# Patient Record
Sex: Female | Born: 1960 | Race: White | Hispanic: No | Marital: Married
Health system: Midwestern US, Academic
[De-identification: ages and names within clinical notes are randomized; demographics above are authoritative.]

---

## 2021-04-24 ENCOUNTER — Encounter: Admit: 2021-04-24 | Discharge: 2021-04-24 | Payer: BC Managed Care – HMO

## 2021-04-26 ENCOUNTER — Encounter: Admit: 2021-04-26 | Discharge: 2021-04-26 | Payer: BC Managed Care – HMO

## 2021-04-26 NOTE — Patient Instructions
It was nice to see you today.  Thank you for choosing to visit our clinic.  Your time is important, and if you had to wait today, we do apologize.  Our goal is to run exactly on time.  However, on occasion, we get behind in clinic due to unexpected patient issues.  Thank you for your patience.    General Instructions:  Scheduling:  Our scheduling phone number is [phone].  Appointment Reminders on your cell phone:  Communication preferences can be managed in MyChart to ensure you receive important appointment notifications  How to reach our office:  Please send a MyChart message to the [facility] Center (directed to Dr. Cordell) or leave a voicemail for the nurse, Arly Salminen, at [phone].  Support for many chronic illnesses is available through [facility] at [email or web address] or [phone].    For help with MyChart:  please call [phone].    For more information on spinal conditions:  please visit [email or web address]     Again, thank you for coming in today.

## 2021-04-27 ENCOUNTER — Encounter: Admit: 2021-04-27 | Discharge: 2021-04-27 | Payer: BC Managed Care – HMO

## 2021-04-27 DIAGNOSIS — F909 Attention-deficit hyperactivity disorder, unspecified type: Secondary | ICD-10-CM

## 2021-04-27 DIAGNOSIS — M5136 Other intervertebral disc degeneration, lumbar region: Secondary | ICD-10-CM

## 2021-04-27 DIAGNOSIS — M2550 Pain in unspecified joint: Secondary | ICD-10-CM

## 2021-04-27 NOTE — Telephone Encounter
Pre Visit Planning - New Patient    Reason for Visit: LBP    Referred by: self    Imaging: patient to bring per appt note    Scoliosis: unknown    Previous spine surgery: unknown    Pain Management: unknown    EMG/NCS: unknown    Records Requested: N/A - no documentation of any previous facility, physician, or treatment in EMR    Orders have been pended    New patient packet: RN LVM that new patient questionnaires must be done PRIOR to visit or they will have to reschedule; advised patient to complete via MyChart or arrive at least on hour early to complete on paper.  Advised patient to bring any external imaging on disc to appt; informed patient that we will obtain updated xrays at start of visit.  Invited return call, if necessary.

## 2021-04-28 ENCOUNTER — Encounter: Admit: 2021-04-28 | Discharge: 2021-04-28 | Payer: BC Managed Care – HMO

## 2021-04-28 ENCOUNTER — Ambulatory Visit: Admit: 2021-04-28 | Discharge: 2021-04-28 | Payer: BC Managed Care – HMO

## 2021-04-28 DIAGNOSIS — M545 Chronic low back pain, unspecified back pain laterality, unspecified whether sciatica present: Secondary | ICD-10-CM

## 2021-04-28 DIAGNOSIS — M5136 Other intervertebral disc degeneration, lumbar region: Secondary | ICD-10-CM

## 2021-04-28 DIAGNOSIS — F909 Attention-deficit hyperactivity disorder, unspecified type: Secondary | ICD-10-CM

## 2021-04-28 DIAGNOSIS — M48061 Spinal stenosis, lumbar region without neurogenic claudication: Secondary | ICD-10-CM

## 2021-04-28 DIAGNOSIS — M2550 Pain in unspecified joint: Secondary | ICD-10-CM

## 2021-04-28 NOTE — Progress Notes
SPINE CENTER HISTORY AND PHYSICAL    Chief Complaint   Patient presents with   ? Lower Back - New Patient                 Vitals:    04/28/21 0745   BP: (!) 150/80   Pulse: 98   Temp: 36.5 ?C (97.7 ?F)   SpO2: 98%   PainSc: Seven   Weight: 127 kg (280 lb)   Height: 170.2 cm (5' 7)        Medical History:   Diagnosis Date   ? ADHD (attention deficit hyperactivity disorder) 2015   ? Degenerative disc disease, lumbar     Many years   ? Joint pain     Over several years         Surgical History:   Procedure Laterality Date   ? ABDOMEN SURGERY  12/29/2008    Lap Band   ? HX HYSTERECTOMY  02/28/1993    Approx, vaginal hyx, still have ovaries   ? HX JOINT REPLACEMENT  04/18/2017    Rt Knee replacement @ [facility]   ? KNEE SURGERY  04/18/2017    Rt Knee   ? PR LAPAROSCOPY SURG RPR INITIAL INGUINAL HERNIA  12/29/2008    With Lap Band Surgery         Not on File      Current Outpatient Medications on File Prior to Visit   Medication Sig Dispense Refill   ? celecoxib (CELEBREX) 200 mg capsule Take one capsule by mouth twice daily.     ? cyclobenzaprine (FLEXERIL) 5 mg tablet      ? darifenacin ER (ENABLEX) 7.5 mg tablet      ? escitalopram oxalate (LEXAPRO) 10 mg tablet      ? lidocaine (LIDODERM) 5 % topical patch      ? methylphenidate (DAYTRANA) 20 mg/9 hr patch      ? methylphenidate HCL (METADATE ER) 20 mg ER tablet      ? oxybutynin XL (DITROPAN XL) 10 mg tablet Take one tablet by mouth daily.     ? oxyCODONE 10 mg tablet Take one tablet by mouth every 4 hours as needed.     ? pantoprazole DR (PROTONIX) 40 mg tablet Take one tablet by mouth twice daily.     ? vibegron (GEMTESA) 75 mg tablet        No current facility-administered medications on file prior to visit.         family history includes Alcohol liver disease in her paternal grandmother; Arthritis in her mother and sister; Cancer in her mother; Stroke in her maternal grandmother.      Social History     Socioeconomic History   ? Marital status: Married   Tobacco Use ? Smoking status: Former     Packs/day: 1.00     Years: 25.00     Pack years: 25.00     Types: Cigarettes     Quit date: 04/29/2003     Years since quitting: 18.0   ? Smokeless tobacco: Never   ? Tobacco comments:     Quit cold [place] with bronchitis   Substance and Sexual Activity   ? Alcohol use: Yes     Alcohol/week: 3.0 standard drinks     Types: 2 Glasses of wine, 1 Drinks containing 0.5 oz of alcohol per week     Comment: Social drinker   ? Drug use: Never   ? Sexual activity: Not Currently  Review of Systems       HPI / PHYSICAL EXAM / RADIOGRAPHIC EVALUATION /  IMPRESSION / PLAN      Dictation on: 04/28/2021  9:31 AM by: Wyona Almas [LCORDELL]                Total time 70 minutes.

## 2021-05-17 ENCOUNTER — Encounter: Admit: 2021-05-17 | Discharge: 2021-05-17 | Payer: BC Managed Care – HMO

## 2021-07-29 ENCOUNTER — Encounter: Admit: 2021-07-29 | Discharge: 2021-07-29 | Payer: BC Managed Care – HMO

## 2021-07-29 NOTE — Patient Instructions
It was nice to see you today.  Thank you for choosing to visit our clinic.  Your time is important, and if you had to wait today, we do apologize.  Our goal is to run exactly on time.  However, on occasion, we get behind in clinic due to unexpected patient issues.  Thank you for your patience.    General Instructions:  Scheduling:  Our scheduling phone number is [phone].  Appointment Reminders on your cell phone:  Communication preferences can be managed in MyChart to ensure you receive important appointment notifications  How to reach our office:  Please send a MyChart message to the Spine Center (directed to Dr. Cordell) or leave a voicemail for the nurse, Reveca Desmarais, at [phone].  Support for many chronic illnesses is available through [facility] at [facility] or [phone].    For help with MyChart:  please call [phone].    For more information on spinal conditions:  please visit [email or web address]     Again, thank you for coming in today.

## 2021-07-29 NOTE — Telephone Encounter
RN called [facility] and spoke with Rosalita Chessman who states patient was seen at the Pain clinic by Dr. Laural Benes.  RN sent fax to request copies of pain management injections.  Transmission successful.

## 2021-08-02 ENCOUNTER — Encounter: Admit: 2021-08-02 | Discharge: 2021-08-02 | Payer: BC Managed Care – HMO

## 2021-08-03 ENCOUNTER — Encounter: Admit: 2021-08-03 | Discharge: 2021-08-03 | Payer: BC Managed Care – HMO

## 2021-08-04 ENCOUNTER — Ambulatory Visit: Admit: 2021-08-04 | Discharge: 2021-08-04 | Payer: BC Managed Care – HMO

## 2021-08-04 ENCOUNTER — Encounter: Admit: 2021-08-04 | Discharge: 2021-08-04 | Payer: BC Managed Care – HMO

## 2021-08-04 DIAGNOSIS — M48061 Spinal stenosis, lumbar region without neurogenic claudication: Secondary | ICD-10-CM

## 2021-08-04 DIAGNOSIS — M2550 Pain in unspecified joint: Secondary | ICD-10-CM

## 2021-08-04 DIAGNOSIS — M5136 Other intervertebral disc degeneration, lumbar region: Secondary | ICD-10-CM

## 2021-08-04 DIAGNOSIS — M1712 Unilateral primary osteoarthritis, left knee: Secondary | ICD-10-CM

## 2021-08-04 DIAGNOSIS — F909 Attention-deficit hyperactivity disorder, unspecified type: Secondary | ICD-10-CM

## 2021-08-04 DIAGNOSIS — M1611 Unilateral primary osteoarthritis, right hip: Secondary | ICD-10-CM

## 2021-08-04 NOTE — Progress Notes
SPINE CENTER CLINIC NOTE     Dictation on: 08/04/2021 10:46 AM by: Wyona Almas [LCORDELL]                Vitals:    08/04/21 0947   BP: (!) 146/71   Pulse: 82   Temp: 36.8 C (98.2 F)   Resp: 18   SpO2: 100%   PainSc: Seven   Weight: 119.3 kg (263 lb)   Height: 170.2 cm (5\' 7" )       Review of Systems  Total time 30 minutes.

## 2021-08-04 NOTE — Telephone Encounter
Bone density order faxed to [facility] as requested by patient.

## 2021-08-09 ENCOUNTER — Encounter: Admit: 2021-08-09 | Discharge: 2021-08-09 | Payer: BC Managed Care – HMO

## 2021-08-20 ENCOUNTER — Encounter: Admit: 2021-08-20 | Discharge: 2021-08-20 | Payer: BC Managed Care – HMO

## 2021-08-23 ENCOUNTER — Encounter: Admit: 2021-08-23 | Discharge: 2021-08-23 | Payer: BC Managed Care – HMO

## 2021-08-23 NOTE — Telephone Encounter
Received DEXA report dated 08/12/2021.  RN will share with provider and scan in to [facility].

## 2021-09-06 ENCOUNTER — Encounter: Admit: 2021-09-06 | Discharge: 2021-09-06 | Payer: BC Managed Care – HMO

## 2021-09-06 DIAGNOSIS — M25551 Pain in right hip: Secondary | ICD-10-CM

## 2021-09-07 ENCOUNTER — Encounter: Admit: 2021-09-07 | Discharge: 2021-09-07 | Payer: BC Managed Care – HMO

## 2021-09-07 DIAGNOSIS — M1611 Unilateral primary osteoarthritis, right hip: Secondary | ICD-10-CM

## 2021-09-07 MED ORDER — ACETAMINOPHEN 500 MG PO TAB
1000 mg | Freq: Once | ORAL | 0 refills
Start: 2021-09-07 — End: ?

## 2021-09-07 MED ORDER — LACTATED RINGERS IV SOLP
1000 mL | INTRAVENOUS | 0 refills
Start: 2021-09-07 — End: ?

## 2021-09-07 MED ORDER — OXYCODONE SR 10 MG PO 12HR TABLET
10 mg | Freq: Once | ORAL | 0 refills
Start: 2021-09-07 — End: ?

## 2021-09-07 MED ORDER — CEFAZOLIN INJ 1GM IVP
2 g | Freq: Once | INTRAVENOUS | 0 refills
Start: 2021-09-07 — End: ?

## 2021-09-07 MED ORDER — CELECOXIB 200 MG PO CAP
200 mg | Freq: Once | ORAL | 0 refills
Start: 2021-09-07 — End: ?

## 2021-09-07 MED ORDER — PRESURGERY KIT B
PACK | 0 refills | Status: CN
Start: 2021-09-07 — End: ?

## 2021-09-08 ENCOUNTER — Encounter: Admit: 2021-09-08 | Discharge: 2021-09-08 | Payer: BC Managed Care – HMO

## 2021-09-09 ENCOUNTER — Ambulatory Visit: Admit: 2021-09-09 | Discharge: 2021-09-09 | Payer: BC Managed Care – HMO

## 2021-09-09 ENCOUNTER — Encounter: Admit: 2021-09-09 | Discharge: 2021-09-09 | Payer: BC Managed Care – HMO

## 2021-09-09 DIAGNOSIS — Z96651 Presence of right artificial knee joint: Secondary | ICD-10-CM

## 2021-09-09 DIAGNOSIS — M549 Dorsalgia, unspecified: Secondary | ICD-10-CM

## 2021-09-09 DIAGNOSIS — M25551 Pain in right hip: Secondary | ICD-10-CM

## 2021-09-09 DIAGNOSIS — K219 Gastro-esophageal reflux disease without esophagitis: Secondary | ICD-10-CM

## 2021-09-09 DIAGNOSIS — M5136 Other intervertebral disc degeneration, lumbar region: Secondary | ICD-10-CM

## 2021-09-09 DIAGNOSIS — F909 Attention-deficit hyperactivity disorder, unspecified type: Secondary | ICD-10-CM

## 2021-09-09 DIAGNOSIS — M2550 Pain in unspecified joint: Secondary | ICD-10-CM

## 2021-09-09 LAB — GRAM STAIN: SPECIAL REQUESTS: <2

## 2021-09-09 MED ORDER — NYSTATIN 100,000 UNIT/GRAM TP POWD
Freq: Four times a day (QID) | TOPICAL | 0 refills | 30.00000 days | Status: AC
Start: 2021-09-09 — End: ?

## 2021-09-19 ENCOUNTER — Encounter: Admit: 2021-09-19 | Discharge: 2021-09-19 | Payer: BC Managed Care – HMO

## 2021-09-20 MED FILL — PRESURGERY KIT B: 1 days supply | Qty: 1 | Fill #1 | Status: AC

## 2021-09-30 ENCOUNTER — Encounter: Admit: 2021-09-30 | Discharge: 2021-09-30 | Payer: BC Managed Care – HMO

## 2021-10-01 ENCOUNTER — Ambulatory Visit: Admit: 2021-10-01 | Discharge: 2021-10-01 | Payer: BC Managed Care – HMO

## 2021-10-01 ENCOUNTER — Encounter: Admit: 2021-10-01 | Discharge: 2021-10-01 | Payer: BC Managed Care – HMO

## 2021-10-01 DIAGNOSIS — M5136 Other intervertebral disc degeneration, lumbar region: Secondary | ICD-10-CM

## 2021-10-01 DIAGNOSIS — F909 Attention-deficit hyperactivity disorder, unspecified type: Secondary | ICD-10-CM

## 2021-10-01 DIAGNOSIS — M2550 Pain in unspecified joint: Secondary | ICD-10-CM

## 2021-10-01 DIAGNOSIS — M1611 Unilateral primary osteoarthritis, right hip: Secondary | ICD-10-CM

## 2021-10-01 DIAGNOSIS — M549 Dorsalgia, unspecified: Secondary | ICD-10-CM

## 2021-10-01 DIAGNOSIS — Z96651 Presence of right artificial knee joint: Secondary | ICD-10-CM

## 2021-10-01 DIAGNOSIS — K219 Gastro-esophageal reflux disease without esophagitis: Secondary | ICD-10-CM

## 2021-10-01 LAB — CBC AND DIFF
ABSOLUTE BASO COUNT: 0 K/UL (ref 0–0.20)
ABSOLUTE EOS COUNT: 0.2 K/UL (ref 0–0.45)
ABSOLUTE LYMPH COUNT: 1.8 K/UL (ref 1.0–4.8)
ABSOLUTE MONO COUNT: 0.3 K/UL (ref 0–0.80)
ABSOLUTE NEUTROPHIL: 2.8 K/UL (ref 1.8–7.0)
BASOPHILS %: 1 % (ref 0–2)
EOSINOPHILS %: 5 % (ref 0–5)
HEMATOCRIT: 39 % (ref 36–45)
HEMOGLOBIN: 12 g/dL (ref 12.0–15.0)
LYMPHOCYTES %: 33 % (ref 24–44)
MCHC: 31 g/dL — ABNORMAL LOW (ref 32.0–36.0)
MONOCYTES %: 7 % (ref 4–12)
MPV: 8.2 FL (ref 7–11)
NEUTROPHILS %: 54 % (ref 41–77)
PLATELET COUNT: 254 K/UL (ref 150–400)
RBC COUNT: 5 M/UL — ABNORMAL HIGH (ref 4.0–5.0)
RDW: 17 % — ABNORMAL HIGH (ref 11–15)
WBC COUNT: 5.4 K/UL (ref 4.5–11.0)

## 2021-10-01 LAB — PROTIME INR (PT): PROTIME: 11 s — ABNORMAL LOW (ref 9.5–14.2)

## 2021-10-01 LAB — PTT (APTT): PTT: 32 s — ABNORMAL LOW (ref 24.0–36.5)

## 2021-10-01 LAB — C REACTIVE PROTEIN (CRP): C-REACTIVE PROTEIN: 0.5 mg/dL (ref <1.0)

## 2021-10-01 LAB — SED RATE: ESR: 35 mm/h — ABNORMAL HIGH (ref 0–30)

## 2021-10-13 ENCOUNTER — Encounter: Admit: 2021-10-13 | Discharge: 2021-10-13 | Payer: BC Managed Care – HMO

## 2021-10-13 MED ORDER — NYSTATIN 100,000 UNIT/GRAM TP POWD
Freq: Four times a day (QID) | TOPICAL | 0 refills | 30.00000 days | Status: AC
Start: 2021-10-13 — End: ?

## 2021-10-19 ENCOUNTER — Encounter: Admit: 2021-10-19 | Discharge: 2021-10-19 | Payer: BC Managed Care – HMO

## 2021-10-19 DIAGNOSIS — M549 Dorsalgia, unspecified: Secondary | ICD-10-CM

## 2021-10-19 DIAGNOSIS — F909 Attention-deficit hyperactivity disorder, unspecified type: Secondary | ICD-10-CM

## 2021-10-19 DIAGNOSIS — M5136 Other intervertebral disc degeneration, lumbar region: Secondary | ICD-10-CM

## 2021-10-19 DIAGNOSIS — M2550 Pain in unspecified joint: Secondary | ICD-10-CM

## 2021-10-19 DIAGNOSIS — K219 Gastro-esophageal reflux disease without esophagitis: Secondary | ICD-10-CM

## 2021-10-20 ENCOUNTER — Encounter: Admit: 2021-10-20 | Discharge: 2021-10-20 | Payer: BC Managed Care – HMO

## 2021-10-20 DIAGNOSIS — M5136 Other intervertebral disc degeneration, lumbar region: Secondary | ICD-10-CM

## 2021-10-20 DIAGNOSIS — Z96641 Presence of right artificial hip joint: Secondary | ICD-10-CM

## 2021-10-20 DIAGNOSIS — M549 Dorsalgia, unspecified: Secondary | ICD-10-CM

## 2021-10-20 DIAGNOSIS — M2550 Pain in unspecified joint: Secondary | ICD-10-CM

## 2021-10-20 DIAGNOSIS — F909 Attention-deficit hyperactivity disorder, unspecified type: Secondary | ICD-10-CM

## 2021-10-20 DIAGNOSIS — M1611 Unilateral primary osteoarthritis, right hip: Secondary | ICD-10-CM

## 2021-10-20 DIAGNOSIS — K219 Gastro-esophageal reflux disease without esophagitis: Secondary | ICD-10-CM

## 2021-10-20 DIAGNOSIS — M25551 Pain in right hip: Secondary | ICD-10-CM

## 2021-10-20 MED ORDER — CELECOXIB 200 MG PO CAP
200 mg | Freq: Once | ORAL | 0 refills
Start: 2021-10-20 — End: ?

## 2021-10-20 MED ORDER — LACTATED RINGERS IV SOLP
1000 mL | INTRAVENOUS | 0 refills
Start: 2021-10-20 — End: ?

## 2021-10-20 MED ORDER — OXYCODONE SR 10 MG PO 12HR TABLET
10 mg | Freq: Once | ORAL | 0 refills
Start: 2021-10-20 — End: ?

## 2021-10-20 MED ORDER — CEFAZOLIN INJ 1GM IVP
2 g | Freq: Once | INTRAVENOUS | 0 refills
Start: 2021-10-20 — End: ?

## 2021-10-20 MED ORDER — PRESURGERY KIT B
1 | PACK | Freq: Once | 0 refills | Status: AC
Start: 2021-10-20 — End: ?
  Filled 2021-10-21: qty 1, 1d supply, fill #1

## 2021-10-20 MED ORDER — SODIUM CHLORIDE 0.9 % IV SOLP
250 mL | INTRAVENOUS | 0 refills
Start: 2021-10-20 — End: ?

## 2021-10-20 MED ORDER — ACETAMINOPHEN 500 MG PO TAB
1000 mg | Freq: Once | ORAL | 0 refills
Start: 2021-10-20 — End: ?

## 2021-10-20 NOTE — Pre-Anesthesia Patient Instructions
PREPROCEDURE INFORMATION    Arrival at the hospital  [facility]  [address]  [place], [place] [address]    Park in the [employer], located directly across from the main entrance to the hospital.  Enter through the ground floor main hospital entrance and check in at the Information Desk in the lobby.  They will validate your parking ticket and direct you to the next location.    You will receive a call with your surgery arrival time between 2:30pm and 4:30pm the last business day before your procedure.  If you do not receive a call, please call [phone] before 4:30pm or [phone] after 4:30pm.    Eating or drinking before surgery  Nothing to eat after 11:00pm the night before your surgery including gum, mint, candy. You may have clear liquids up to 2 hours before your surgery time. If you have received specific instructions from your surgeon, please follow those.     Clear Liquid Examples:   Clear juice (Apple or cranberry), no pulp   Coffee and tea with or without sugar (no cream)   Sports drinks - Powerade/Gatorade   [employer] transportation for outpatient procedure  For your safety, you will need to arrange for a responsible ride/person to accompany you home due to sedation or anesthesia with your procedure.  An [employer] Goad, taxi or other public transportation driver is not considered a responsible person to accompany you home.    Bath/Shower Instructions  Take a bath or shower with antibacterial soap the night before and the morning of your procedure. Use clean towels.  Put on clean clothes after bath or shower.  Avoid using lotion and oils.  Sleep on clean sheets if bath or shower is done the night before procedure.  Wash using the antibacterial wipes you received in your pre-surgery kit as directed.    Morning of your procedure:  Brush your teeth and tongue  Do not smoke, vape, chew or user any tobacco products.  Do not shave the area where you will have surgery.  Remove nail polish, makeup and all jewelry (including piercings) before coming to the hospital.  Dress in clean, loose, comfortable clothing.    Valuables  Leave money, credit cards, jewelry, and any other valuables at home. [facility] is not responsible for the loss or breakage of personal items.    What to bring to the hospital  ID/Insurance card  Medical Device card  Official documents for legal guardianship  Copy of your Living Will, Advanced Directives, and/or Durable Power of Attorney. If you have these documents, please bring them to the admissions office on the day of your surgery to be scanned into your records.  Do not bring medications from home unless instructed by a pharmacist.  Cases for glasses/hearing aids/contact lens (bring solutions for contacts)     Notify us at [facility]: [phone] on the day of your procedure if:  You need to cancel your procedure.  You are going to be late.    Notify your surgeon if:  You become ill with a cough, fever, sore throat, nausea, vomiting or flu-like symptoms.  You have any open wounds/sores that are red, painful, draining, or are new since you last saw the doctor.  You need to cancel your procedure.    Preparing to get your medications at discharge  Your surgeon may prescribe you medications to take after your procedure.  If you like  the convenience of having your medications filled here at [facility], please do the following:  Go to [facility] after your PAC appointment to put a credit card on file.  Call [facility] pharmacy at [phone] (Monday-Friday 7am-9pm or Saturday and Sunday 9am-5pm) to put a credit card on file.  Bring a credit card or cash on the day of your procedure- please leave with a family member rather than bringing it into the preop area.    Current Visitor Policy:  Visitors must be free of fever and symptoms to be in our facilities.  No more than 2 visitors per patient are allowed.  Additional guidelines may vary, based on patient care area or patient's condition.  Patients in semiprivate rooms may have visitors, but visits should be coordinated so only two total visitors are in a room at a time due to space limitations.  Children younger than age 12 are allowed to visit inpatients.    Thank you for participating in your Preoperative Assessment Clinic visit today.    If you have any changes to your health or hospitalizations between now and your surgery, please call us at [phone] for Main instructions.    Instructions given to patient via: verbal

## 2021-10-20 NOTE — Unmapped
[facility] Anesthesia Pre-Procedure Evaluation    Name: Dorothy Mueller      MRN: [id number]     DOB: Jun 04, 1960     Age: 61 y.o.     Sex: female   _________________________________________________________________________     Procedure Info:   Procedure Information     Date/Time: 11/15/21 1255    Procedure: ANTERIOR ARTHROPLASTY TOTAL HIP WITH/ WITHOUT AUTOGRAFT/ ALLOGRAFT (Right) - 2 hrs., hana table *JT - ant R THA: 54+0 cup, 6 STD Actis, 36+5 ceramic**    Location: MAIN OR 36 / Main OR/Periop    Surgeons: Cherly Beach, MD          Physical Assessment  Vital Signs (last filed in past 24 hours):         Patient History   No Known Allergies     Current Medications    Medication Directions   celecoxib (CELEBREX) 200 mg capsule Take two capsules by mouth daily.   cyclobenzaprine (FLEXERIL) 5 mg tablet Take one tablet by mouth daily as needed.   darifenacin ER (ENABLEX) 15 mg tablet Take one tablet by mouth daily.   lidocaine (LIDODERM) 5 % topical patch Apply one patch topically to affected area every 24 hours as needed.   methylphenidate HCL (METADATE ER) 20 mg ER tablet Take one tablet by mouth daily before breakfast.   nystatin (NYSTOP) 100,000 unit/g topical powder Apply  topically to affected area four times daily.   nystatin-triamcinolone (MYCOLOG II) 100,000 unit/g / 0.1 % topical ointment Apply  topically to affected area twice daily.   oxybutynin XL (DITROPAN XL) 10 mg tablet Take one tablet by mouth daily.   oxyCODONE 10 mg tablet Take one tablet by mouth daily as needed.   pantoprazole DR (PROTONIX) 40 mg tablet Take one tablet by mouth daily.   PRESURGERY KIT B Use one kit as directed once for 1 dose. DOS  9.18.23   pumpkin seed extract-soy germ (AZO BLADDER CONTROL) 300 mg cap Take two capsules by mouth daily.       Review of Systems/Medical History      Patient summary reviewed  Nursing notes reviewed  Pertinent labs reviewed    PONV Screening: Non-smoker and Female sex  No history of anesthetic complications  No family history of anesthetic complications      Airway - negative        Pulmonary      Smoker: quit in 2005, [facility].        Obstructive Sleep Apnea (pt underwent weight loss surgery and discontinued CPAP, high risk STOP BANG score)      Cardiovascular         Exercise tolerance: >4 METS (5.07 METs per DASI, limited by hip pain)      Hypertension: mild elevation today, pt reports typically normal.              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)  Constitution - negative   PHYSICAL EXAM     Diagnostic Tests  Hematology:   Lab Results   Component Value Date    HGB 12.7 10/01/2021    HCT 39.9 10/01/2021    PLTCT 254 10/01/2021    WBC 5.4 10/01/2021    NEUT 54 10/01/2021    ANC 2.88 10/01/2021    ALC 1.81 10/01/2021    MONA 7 10/01/2021    AMC 0.39 10/01/2021    EOSA 5 10/01/2021    ABC 0.07 10/01/2021    MCV 79.5 10/01/2021    MCH 25.3 10/01/2021    MCHC 31.9 10/01/2021    MPV 8.2 10/01/2021    RDW 17.2 10/01/2021         General Chemistry:   Lab Results   Component Value Date    NA 140 10/01/2021    K 3.7 10/01/2021    CL 104 10/01/2021    CO2 25 10/01/2021    GAP 11 10/01/2021    BUN 16 10/01/2021    CR 0.65 10/01/2021    GLU 82 10/01/2021    CA 9.3 10/01/2021    ALBUMIN 4.2 10/01/2021    TOTBILI 0.5 10/01/2021      Coagulation:   Lab Results   Component Value Date    PT 11.2 10/01/2021    PTT 32.3 10/01/2021    INR 1.0 10/01/2021       PAC Plan    Interview: Phone Screen Interview                                        Alerts

## 2021-10-21 ENCOUNTER — Encounter: Admit: 2021-10-21 | Discharge: 2021-10-21 | Payer: BC Managed Care – HMO

## 2021-11-05 ENCOUNTER — Encounter: Admit: 2021-11-05 | Discharge: 2021-11-05 | Payer: BC Managed Care – HMO

## 2021-11-05 ENCOUNTER — Ambulatory Visit: Admit: 2021-11-05 | Discharge: 2021-11-05 | Payer: BC Managed Care – HMO

## 2021-11-05 DIAGNOSIS — M5136 Other intervertebral disc degeneration, lumbar region: Secondary | ICD-10-CM

## 2021-11-05 DIAGNOSIS — F909 Attention-deficit hyperactivity disorder, unspecified type: Secondary | ICD-10-CM

## 2021-11-05 DIAGNOSIS — R300 Dysuria: Secondary | ICD-10-CM

## 2021-11-05 DIAGNOSIS — K219 Gastro-esophageal reflux disease without esophagitis: Secondary | ICD-10-CM

## 2021-11-05 DIAGNOSIS — Z96641 Presence of right artificial hip joint: Secondary | ICD-10-CM

## 2021-11-05 DIAGNOSIS — M549 Dorsalgia, unspecified: Secondary | ICD-10-CM

## 2021-11-05 DIAGNOSIS — M2550 Pain in unspecified joint: Secondary | ICD-10-CM

## 2021-11-05 LAB — COMPREHENSIVE METABOLIC PANEL
ALBUMIN: 4 g/dL (ref 3.5–5.0)
ALK PHOSPHATASE: 80 U/L (ref 25–110)
ALT: 15 U/L (ref 7–56)
ANION GAP: 11 (ref 3–12)
AST: 16 U/L (ref 7–40)
CALCIUM: 9.6 mg/dL (ref 8.5–10.6)
CHLORIDE: 104 MMOL/L (ref 98–110)
CO2: 25 MMOL/L (ref 21–30)
CREATININE: 0.8 mg/dL (ref 0.4–1.00)
EGFR: >60 mL/min (ref >60)
GLUCOSE,PANEL: 98 mg/dL (ref 70–100)
POTASSIUM: 3.8 MMOL/L — ABNORMAL HIGH (ref 3.5–5.1)
SODIUM: 140 MMOL/L (ref 137–147)
TOTAL BILIRUBIN: 0.4 mg/dL (ref 0.3–1.2)
TOTAL PROTEIN: 7.3 g/dL (ref 6.0–8.0)

## 2021-11-05 LAB — CBC AND DIFF
HEMOGLOBIN: 12 g/dL (ref 12.0–15.0)
MCH: 26 pg (ref 26–34)
NEUTROPHILS %: 47 % (ref 41–77)
RBC COUNT: 4.6 M/UL (ref 4.0–5.0)
WBC COUNT: 5.5 K/UL (ref 4.5–11.0)

## 2021-11-05 LAB — PTT (APTT): PTT: 30 s — ABNORMAL LOW (ref 24.0–36.5)

## 2021-11-05 LAB — PROTIME INR (PT): INR: 1.1 % (ref 0.8–1.2)

## 2021-11-06 ENCOUNTER — Encounter: Admit: 2021-11-06 | Discharge: 2021-11-06 | Payer: BC Managed Care – HMO

## 2021-11-10 ENCOUNTER — Encounter: Admit: 2021-11-10 | Discharge: 2021-11-10 | Payer: BC Managed Care – HMO

## 2021-11-10 ENCOUNTER — Ambulatory Visit: Admit: 2021-11-10 | Discharge: 2021-11-10 | Payer: BC Managed Care – HMO

## 2021-11-10 DIAGNOSIS — R300 Dysuria: Secondary | ICD-10-CM

## 2021-11-10 LAB — URINALYSIS MICROSCOPIC REFLEX TO CULTURE

## 2021-11-10 LAB — URINALYSIS DIPSTICK REFLEX TO CULTURE
URINE PH: 5 (ref 5.0–8.0)
URINE SPEC GRAVITY: 1 (ref 1.005–1.030)

## 2021-11-11 ENCOUNTER — Encounter: Admit: 2021-11-11 | Discharge: 2021-11-11 | Payer: BC Managed Care – HMO

## 2021-11-12 ENCOUNTER — Encounter: Admit: 2021-11-12 | Discharge: 2021-11-12 | Payer: BC Managed Care – HMO

## 2021-11-15 ENCOUNTER — Encounter: Admit: 2021-11-15 | Discharge: 2021-11-15 | Payer: BC Managed Care – HMO

## 2021-11-15 DIAGNOSIS — K219 Gastro-esophageal reflux disease without esophagitis: Secondary | ICD-10-CM

## 2021-11-15 DIAGNOSIS — F909 Attention-deficit hyperactivity disorder, unspecified type: Secondary | ICD-10-CM

## 2021-11-15 DIAGNOSIS — M1611 Unilateral primary osteoarthritis, right hip: Secondary | ICD-10-CM

## 2021-11-15 DIAGNOSIS — M549 Dorsalgia, unspecified: Secondary | ICD-10-CM

## 2021-11-15 DIAGNOSIS — M2550 Pain in unspecified joint: Secondary | ICD-10-CM

## 2021-11-15 DIAGNOSIS — M5136 Other intervertebral disc degeneration, lumbar region: Secondary | ICD-10-CM

## 2021-11-15 MED ORDER — NYSTATIN-TRIAMCINOLONE 100,000-0.1 UNIT/GRAM-% TP OINT
1 refills
Start: 2021-11-15 — End: ?

## 2021-11-15 MED ORDER — NYSTATIN-TRIAMCINOLONE 100,000-0.1 UNIT/GRAM-% TP OINT
Freq: Two times a day (BID) | TOPICAL | 1 refills | 25.00000 days | Status: AC
Start: 2021-11-15 — End: ?

## 2021-11-15 MED ORDER — CHLORHEXIDINE GLUCONATE 4 % TP LIQD
TOPICAL | 0 refills | 23.00000 days | Status: AC | PRN
Start: 2021-11-15 — End: ?

## 2021-11-15 MED ADMIN — OXYCODONE SR 10 MG PO 12HR TABLET [323983]: 10 mg | ORAL | @ 12:00:00 | Stop: 2021-11-15 | NDC 59011041020

## 2021-11-15 MED ADMIN — LACTATED RINGERS IV SOLP [4318]: 1000.000 mL | INTRAVENOUS | @ 12:00:00 | Stop: 2021-11-17 | NDC 00338011704

## 2021-11-16 ENCOUNTER — Encounter: Admit: 2021-11-16 | Discharge: 2021-11-16 | Payer: BC Managed Care – HMO

## 2021-11-18 ENCOUNTER — Encounter: Admit: 2021-11-18 | Discharge: 2021-11-18 | Payer: BC Managed Care – HMO

## 2021-11-24 ENCOUNTER — Encounter: Admit: 2021-11-24 | Discharge: 2021-11-24 | Payer: BC Managed Care – HMO

## 2021-12-10 ENCOUNTER — Encounter: Admit: 2021-12-10 | Discharge: 2021-12-10 | Payer: BC Managed Care – HMO

## 2021-12-10 DIAGNOSIS — Z96651 Presence of right artificial knee joint: Secondary | ICD-10-CM

## 2021-12-10 DIAGNOSIS — M25551 Pain in right hip: Secondary | ICD-10-CM

## 2021-12-10 MED ORDER — OXYCODONE SR 10 MG PO 12HR TABLET
10 mg | Freq: Once | ORAL | 0 refills
Start: 2021-12-10 — End: ?

## 2021-12-10 MED ORDER — CELECOXIB 200 MG PO CAP
200 mg | Freq: Once | ORAL | 0 refills
Start: 2021-12-10 — End: ?

## 2021-12-10 MED ORDER — CEFAZOLIN INJ 1GM IVP
2 g | Freq: Once | INTRAVENOUS | 0 refills
Start: 2021-12-10 — End: ?

## 2021-12-10 MED ORDER — ACETAMINOPHEN 500 MG PO TAB
1000 mg | Freq: Once | ORAL | 0 refills
Start: 2021-12-10 — End: ?

## 2021-12-10 MED ORDER — LACTATED RINGERS IV SOLP
1000 mL | INTRAVENOUS | 0 refills
Start: 2021-12-10 — End: ?

## 2021-12-10 MED ORDER — SODIUM CHLORIDE 0.9 % IV SOLP
250 mL | INTRAVENOUS | 0 refills
Start: 2021-12-10 — End: ?

## 2021-12-24 ENCOUNTER — Encounter: Admit: 2021-12-24 | Discharge: 2021-12-24 | Payer: BC Managed Care – HMO

## 2021-12-29 ENCOUNTER — Encounter: Admit: 2021-12-29 | Discharge: 2021-12-29 | Payer: BC Managed Care – HMO

## 2021-12-30 ENCOUNTER — Encounter: Admit: 2021-12-30 | Discharge: 2021-12-30 | Payer: BC Managed Care – HMO

## 2021-12-30 ENCOUNTER — Ambulatory Visit: Admit: 2021-12-30 | Discharge: 2021-12-30 | Payer: BC Managed Care – HMO

## 2021-12-30 DIAGNOSIS — M25551 Pain in right hip: Secondary | ICD-10-CM

## 2021-12-30 LAB — PROTIME INR (PT)
INR: 1 % (ref 0.8–1.2)
PROTIME: 11 s (ref 9.5–14.2)

## 2021-12-30 LAB — COMPREHENSIVE METABOLIC PANEL
ALBUMIN: 4.1 g/dL (ref 3.5–5.0)
ANION GAP: 9 (ref 3–12)
AST: 13 U/L (ref 7–40)
CHLORIDE: 104 MMOL/L (ref 98–110)
CO2: 28 MMOL/L (ref 21–30)
CREATININE: 0.6 mg/dL (ref 0.4–1.00)
EGFR: >60 mL/min (ref >60)
GLUCOSE,PANEL: 91 mg/dL (ref 70–100)
POTASSIUM: 4.3 MMOL/L — ABNORMAL HIGH (ref 3.5–5.1)
SODIUM: 141 MMOL/L (ref 137–147)
TOTAL BILIRUBIN: 0.4 mg/dL (ref 0.3–1.2)
TOTAL PROTEIN: 7 g/dL (ref 6.0–8.0)

## 2021-12-30 LAB — CBC AND DIFF
ABSOLUTE BASO COUNT: 0.1 K/UL (ref 0–0.20)
ABSOLUTE LYMPH COUNT: 2 K/UL (ref 1.0–4.8)
MCH: 26 pg (ref 26–34)
MCV: 80 FL (ref 80–100)
MONOCYTES %: 6 % (ref 4–12)
NEUTROPHILS %: 53 % (ref 41–77)
RBC COUNT: 4.9 M/UL (ref 4.0–5.0)
WBC COUNT: 6 K/UL (ref 4.5–11.0)

## 2022-01-04 ENCOUNTER — Encounter: Admit: 2022-01-04 | Discharge: 2022-01-04 | Payer: BC Managed Care – HMO

## 2022-01-04 ENCOUNTER — Ambulatory Visit: Admit: 2022-01-04 | Discharge: 2022-01-04 | Payer: BC Managed Care – HMO

## 2022-01-04 DIAGNOSIS — M549 Dorsalgia, unspecified: Secondary | ICD-10-CM

## 2022-01-04 DIAGNOSIS — M2550 Pain in unspecified joint: Secondary | ICD-10-CM

## 2022-01-04 DIAGNOSIS — F909 Attention-deficit hyperactivity disorder, unspecified type: Secondary | ICD-10-CM

## 2022-01-04 DIAGNOSIS — M5136 Other intervertebral disc degeneration, lumbar region: Secondary | ICD-10-CM

## 2022-01-04 DIAGNOSIS — K219 Gastro-esophageal reflux disease without esophagitis: Secondary | ICD-10-CM

## 2022-01-04 MED ORDER — BUPIVACAINE (PF) 0.5 % (5 MG/ML) IJ SOLN
INTRASPINAL | 0 refills | Status: DC
Start: 2022-01-04 — End: 2022-01-04

## 2022-01-04 MED ORDER — TRANEXAMIC ACID IN NACL,ISO-OS 1,000 MG/100 ML (10 MG/ML) IV PGBK
INTRAVENOUS | 0 refills | Status: DC
Start: 2022-01-04 — End: 2022-01-04

## 2022-01-04 MED ORDER — LIDOCAINE (PF) 10 MG/ML (1 %) IJ SOLN
0 refills | Status: DC
Start: 2022-01-04 — End: 2022-01-04

## 2022-01-04 MED ORDER — PROPOFOL INJ 10 MG/ML IV VIAL
INTRAVENOUS | 0 refills | Status: DC
Start: 2022-01-04 — End: 2022-01-04

## 2022-01-04 MED ORDER — MIDAZOLAM 1 MG/ML IJ SOLN
INTRAVENOUS | 0 refills | Status: DC
Start: 2022-01-04 — End: 2022-01-04

## 2022-01-04 MED ORDER — PHENYLEPHRINE 40 MCG/ML IN NS IV DRIP (STD CONC)
INTRAVENOUS | 0 refills | Status: DC
Start: 2022-01-04 — End: 2022-01-04
  Administered 2022-01-04 (×2): .5 ug/kg/min via INTRAVENOUS

## 2022-01-04 MED ORDER — LIDOCAINE (PF) 20 MG/ML (2 %) IJ SOLN
INTRAVENOUS | 0 refills | Status: DC
Start: 2022-01-04 — End: 2022-01-04

## 2022-01-04 MED ORDER — PROPOFOL 10 MG/ML IV EMUL 100 ML (INFUSION)(AM)(OR)
INTRAVENOUS | 0 refills | Status: DC
Start: 2022-01-04 — End: 2022-01-04
  Administered 2022-01-04: 16:00:00 100 ug/kg/min via INTRAVENOUS

## 2022-01-04 MED ORDER — DEXAMETHASONE SODIUM PHOSPHATE 10 MG/ML IJ SOLN
INTRAVENOUS | 0 refills | Status: DC
Start: 2022-01-04 — End: 2022-01-04

## 2022-01-04 MED ORDER — ONDANSETRON HCL (PF) 4 MG/2 ML IJ SOLN
INTRAVENOUS | 0 refills | Status: DC
Start: 2022-01-04 — End: 2022-01-04

## 2022-01-04 MED ORDER — PHENYLEPHRINE HCL IN 0.9% NACL 1 MG/10 ML (100 MCG/ML) IV SYRG
INTRAVENOUS | 0 refills | Status: DC
Start: 2022-01-04 — End: 2022-01-04

## 2022-01-04 MED ORDER — FENTANYL CITRATE (PF) 50 MCG/ML IJ SOLN
INTRAVENOUS | 0 refills | Status: DC
Start: 2022-01-04 — End: 2022-01-04

## 2022-01-04 MED ADMIN — CEFAZOLIN INJ 1GM IVP [210319]: 2 g | INTRAVENOUS | @ 16:00:00 | Stop: 2022-01-04 | NDC 00143992490

## 2022-01-04 MED ADMIN — TRAMADOL 50 MG PO TAB [14632]: 50 mg | ORAL | @ 22:00:00 | NDC 00904717961

## 2022-01-04 MED ADMIN — WATER FOR INJECTION, STERILE IJ SOLN [79513]: 20 mL | Stop: 2022-01-04 | NDC 63323018508

## 2022-01-04 MED ADMIN — CEFAZOLIN INJ 1GM IVP [210319]: 1 g | INTRAVENOUS | Stop: 2022-01-05 | NDC 60505614200

## 2022-01-04 MED ADMIN — OXYCODONE SR 10 MG PO 12HR TABLET [323983]: 10 mg | ORAL | @ 15:00:00 | Stop: 2022-01-04 | NDC 59011041020

## 2022-01-04 MED ADMIN — ACETAMINOPHEN 500 MG PO TAB [102]: 1000 mg | ORAL | @ 21:00:00 | NDC 00904673061

## 2022-01-04 MED ADMIN — ACETAMINOPHEN 500 MG PO TAB [102]: 500 mg | ORAL | @ 15:00:00 | Stop: 2022-01-04 | NDC 00904673061

## 2022-01-04 MED ADMIN — LACTATED RINGERS IV SOLP [4318]: 1000.000 mL | INTRAVENOUS | @ 16:00:00 | Stop: 2022-01-04 | NDC 00338011704

## 2022-01-04 MED ADMIN — LACTATED RINGERS IV SOLP [4318]: 1000.000 mL | INTRAVENOUS | @ 21:00:00 | Stop: 2022-01-05 | NDC 00338011704

## 2022-01-04 NOTE — Anesthesia Post-Procedure Evaluation
Post-Anesthesia Evaluation    Name: Dorothy Mueller      MRN: [id number]     DOB: 05/14/1960     Age: 61 y.o.     Sex: female   __________________________________________________________________________     Procedure Information     Anesthesia Start Date/Time: 01/04/22 1016    Procedure: ANTERIOR ARTHROPLASTY TOTAL HIP WITH/ WITHOUT AUTOGRAFT/ ALLOGRAFT (Right: Hip) - 2 hrs., hana table - ant R THA: 54+0 cup, 6 STD Actis, 36+5 ceramic    traction up @1112   traction down @1136     Location: [facility] OR 8 / [facility] MAIN OR/PERIOP    Surgeons: , MD          Post-Anesthesia Vitals  BP: 121/72 (11/07 1430)  Temp: 36.6 C (97.8 F) (11/07 1430)  Pulse: 68 (11/07 1430)  Respirations: 18 PER MINUTE (11/07 1430)  SpO2: 93 % (11/07 1430)  O2 Device: None (Room air) (11/07 1430)  Height: 171.5 cm (5' 7.5") (11/07 0900)   Vitals Value Taken Time   BP 126/67 01/04/22 1415   Temp 36.6 C (97.9 F) 01/04/22 1400   Pulse 73 01/04/22 1415   Respirations 16 PER MINUTE 01/04/22 1415   SpO2 95 % 01/04/22 1415   O2 Device None (Room air) 01/04/22 1415   ABP     ART BP           Post Anesthesia Evaluation Note    Evaluation location: pre/post  Patient participation: recovered; patient participated in evaluation  Level of consciousness: alert  Pain management: adequate    Hydration: normovolemia  Temperature: 36.0C - 38.4C  Airway patency: adequate    Perioperative Events       Post-op nausea and vomiting: no PONV    Postoperative Status  Cardiovascular status: hemodynamically stable  Respiratory status: spontaneous ventilation        Perioperative Events  There were no known notable events for this encounter.

## 2022-01-04 NOTE — Anesthesia Pre-Procedure Evaluation
Anesthesia Pre-Procedure Evaluation    Name: Dorothy Mueller      MRN: [id number]     DOB: 1960-05-03     Age: 61 y.o.     Sex: female   _________________________________________________________________________     Procedure Info:   Procedure Information     Date/Time: 01/04/22 1014    Procedure: ANTERIOR ARTHROPLASTY TOTAL HIP WITH/ WITHOUT AUTOGRAFT/ ALLOGRAFT (Right) - 2 hrs., hana table - ant R THA: 54+0 cup, 6 STD Actis, 36+5 ceramic    Location: [facility] OR 8 / [facility] MAIN OR/PERIOP    Surgeons: Cherly Beach, MD          Physical Assessment  Vital Signs (last filed in past 24 hours):         Patient History   No Known Allergies     Current Medications    Medication Directions   celecoxib (CELEBREX) 200 mg capsule Take two capsules by mouth daily.   chlorhexidine gluconate (BETASEPT SURGICAL SCRUB) 4 % topical liquid Apply  topically to affected area every 24 hours as needed.   cyclobenzaprine (FLEXERIL) 5 mg tablet Take one tablet by mouth daily as needed.   darifenacin ER (ENABLEX) 15 mg tablet Take one tablet by mouth daily.   lidocaine (LIDODERM) 5 % topical patch Apply one patch topically to affected area every 24 hours as needed.   methylphenidate HCL (METADATE ER) 20 mg ER tablet Take one tablet by mouth daily before breakfast.   nystatin (NYSTOP) 100,000 unit/g topical powder Apply  topically to affected area four times daily.  Patient not taking: Reported on 12/24/2021   nystatin-triamcinolone (MYCOLOG II) 100,000 unit/g / 0.1 % topical ointment APPLY OINTMENT TOPICALLY TO THE AFFECTED AREA TWICE DAILY AS DIRECTED   nystatin-triamcinolone (MYCOLOG II) 100,000 unit/g / 0.1 % topical ointment Apply  topically to affected area twice daily.   oxyCODONE 10 mg tablet Take one tablet by mouth daily as needed.   pantoprazole DR (PROTONIX) 40 mg tablet Take one tablet by mouth daily.       Review of Systems/Medical History      Patient summary reviewed  Nursing notes reviewed  Pertinent labs reviewed    PONV Screening: Non-smoker, Postoperative opioids and Female sex  No history of anesthetic complications  No family history of anesthetic complications      Airway - negative        Pulmonary      Not a current smoker (quit in 2005, 25 [facility])        No indications/hx of asthma    no COPD      No shortness of breath      Obstructive Sleep Apnea (pt underwent weight loss surgery and discontinued CPAP, high risk STOP BANG score)      Cardiovascular - negative        Exercise tolerance: >4 METS (5.07 METs per DASI, limited by hip pain)      Beta Blocker therapy: No      Beta blockers within 24 hours: n/a      No hypertension ( ),     No valvular problems/murmurs          No past MI:        No hx of coronary artery disease      No PTCA          No dysrhythmias    No angina        No dyspnea on exertion  GI/Hepatic/Renal - negative            GERD, well controlled      No liver disease:       No renal disease:          Barretts esophagus      Neuro/Psych       No seizures      No CVA      No indications/hx of neuropathy      No indications/hx of weakness        Psychiatric history          ADHD      Musculoskeletal         No neck pain      Back pain      Arthritis:  osteo        Endocrine/Other       No diabetes        No hypothyroidism      No hyperthyroidism      No anemia        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    Abdominal Findings:       Obese    Neurological Findings:       Alert and oriented x 3    Constitutional findings:       No acute distress       Diagnostic Tests  Hematology:   Lab Results   Component Value Date    HGB 13.0 12/30/2021    HCT 40.2 12/30/2021    PLTCT 291 12/30/2021    WBC 6.0 12/30/2021    NEUT 53 12/30/2021    ANC 3.23 12/30/2021    ALC 2.03 12/30/2021    MONA 6 12/30/2021    AMC 0.34 12/30/2021    EOSA 5 12/30/2021    ABC 0.11 12/30/2021    MCV 80.5 12/30/2021    MCH 26.0 12/30/2021    MCHC 32.2 12/30/2021    MPV 8.5 12/30/2021    RDW 15.4 12/30/2021         General Chemistry:   Lab Results   Component Value Date    NA 141 12/30/2021    K 4.3 12/30/2021    CL 104 12/30/2021    CO2 28 12/30/2021    GAP 9 12/30/2021    BUN 13 12/30/2021    CR 0.68 12/30/2021    GLU 91 12/30/2021    CA 9.4 12/30/2021    ALBUMIN 4.1 12/30/2021    TOTBILI 0.4 12/30/2021      Coagulation:   Lab Results   Component Value Date    PT 11.2 12/30/2021    PTT 29.9 12/30/2021    INR 1.0 12/30/2021       PAC Plan      Anesthesia Plan    ASA score: 2   Plan: spinal  Induction method: intravenous  NPO status: acceptable  Informed Consent  Anesthetic plan and risks discussed with patient.  Use of blood products discussed with patient  Blood Consent: consented      Plan discussed with: CRNA.  Comments: (I discussed the risks/benefits of proceeding with Monitored Anesthesia Care including: 1)possible awareness during the procedure 2) change of anesthetic plan and conversion to a general anesthetic 3)the need to be awakened during key portions of the procedure by the surgeon.  The patient expressed understanding and will proceed with Monitored Anesthesia Care.    I thoroughly discussed the risks and benefits of regional anesthesia/analgesia including possible temporary or permanent nerve damage, injury to surrounding structures, and incomplete analgesia.  The patient expressed understanding of the risks/benefits and wishes to proceed with regional anesthesia/analgesia technique.  The regional anesthetic/analgesic was performed at the request of the operating surgeon who also expresses understanding of the risks/benefits.)

## 2022-01-04 NOTE — Anesthesia Procedure Notes
Anesthesia Procedure: Spinal Block    SPINAL BLOCK    Date/Time: 01/04/2022 10:28 AM    Patient location: OR  Start time: 01/04/2022 10:28 AM  Reason for block: primary anesthetic and landmark technique    Preprocedure checklist performed: 2 patient identifiers, risks & benefits discussed, patient evaluated, timeout performed, consent obtained, patient being monitored, existing labs reviewed, no anticoagulant within risk period and sterile drape    Sterile technique:  - Proper hand washing  - Cap, mask  - Sterile gloves  - Skin prep for antisepsis      Spinal Block Procedure   Patient position: sitting  Prep: ChloraPrep and patient draped    Monitoring: BP, EKG and continuous pulse ox  Approach: midline  Location: L3-4  Injection technique: single-shot    Needle/catheter:      Needle type: other     Needle gauge: 22 G,      Needle length: 3.5 in    Procedure Outcome  Sensory level: other  Events: cerebral spinal fluid aspirated  Observations: adequate block and patient tolerated the procedure well with no immediate complications        Performed by: Larwance Rote, MD  Authorized by: Larwance Rote, MD

## 2022-01-05 ENCOUNTER — Encounter: Admit: 2022-01-05 | Discharge: 2022-01-05 | Payer: BC Managed Care – HMO

## 2022-01-05 MED ADMIN — CEFAZOLIN INJ 1GM IVP [210319]: 1 g | INTRAVENOUS | @ 08:00:00 | Stop: 2022-01-05 | NDC 60505614200

## 2022-01-05 MED ADMIN — DOCUSATE SODIUM 100 MG PO CAP [2566]: 100 mg | ORAL | @ 15:00:00 | Stop: 2022-01-05 | NDC 60687012911

## 2022-01-05 MED ADMIN — PAROXETINE HCL 10 MG PO TAB [16632]: 10 mg | ORAL | @ 15:00:00 | Stop: 2022-01-05 | NDC 00904567661

## 2022-01-05 MED ADMIN — MAGNESIUM HYDROXIDE 400 MG/5 ML PO SUSP [79944]: 30 mL | ORAL | @ 03:00:00 | NDC 00121043130

## 2022-01-05 MED ADMIN — TRAMADOL 50 MG PO TAB [14632]: 50 mg | ORAL | @ 18:00:00 | Stop: 2022-01-05 | NDC 00904717961

## 2022-01-05 MED ADMIN — OXYCODONE 5 MG PO TAB [10814]: 10 mg | ORAL | @ 03:00:00 | NDC 00406055223

## 2022-01-05 MED ADMIN — DOCUSATE SODIUM 100 MG PO CAP [2566]: 100 mg | ORAL | @ 03:00:00 | NDC 00904718361

## 2022-01-05 MED ADMIN — ASPIRIN 81 MG PO TBEC [14113]: 81 mg | ORAL | @ 15:00:00 | Stop: 2022-01-05 | NDC 63739021202

## 2022-01-05 MED ADMIN — MAGNESIUM HYDROXIDE 400 MG/5 ML PO SUSP [79944]: 30 mL | ORAL | @ 15:00:00 | Stop: 2022-01-05 | NDC 00121043130

## 2022-01-05 MED ADMIN — OXYCODONE 5 MG PO TAB [10814]: 5 mg | ORAL | NDC 00406055223

## 2022-01-05 MED ADMIN — TRAMADOL 50 MG PO TAB [14632]: 50 mg | ORAL | @ 10:00:00 | Stop: 2022-01-05 | NDC 00904717961

## 2022-01-05 MED ADMIN — ACETAMINOPHEN 500 MG PO TAB [102]: 1000 mg | ORAL | @ 02:00:00 | NDC 00904673061

## 2022-01-05 MED ADMIN — WATER FOR INJECTION, STERILE IJ SOLN [79513]: 10 mL | INTRAVENOUS | @ 08:00:00 | Stop: 2022-01-05 | NDC 63323018508

## 2022-01-05 MED ADMIN — OXYCODONE 5 MG PO TAB [10814]: 10 mg | ORAL | @ 15:00:00 | Stop: 2022-01-05 | NDC 00406055223

## 2022-01-05 MED ADMIN — ACETAMINOPHEN 500 MG PO TAB [102]: 1000 mg | ORAL | @ 18:00:00 | Stop: 2022-01-05 | NDC 00904673061

## 2022-01-05 MED ADMIN — ASPIRIN 81 MG PO TBEC [14113]: 81 mg | ORAL | @ 03:00:00 | NDC 63739021202

## 2022-01-05 MED ADMIN — OXYCODONE 5 MG PO TAB [10814]: 10 mg | ORAL | @ 08:00:00 | Stop: 2022-01-05 | NDC 00406055223

## 2022-01-05 MED ADMIN — CELECOXIB 200 MG PO CAP [76958]: 200 mg | ORAL | @ 03:00:00 | NDC 00904650361

## 2022-01-05 MED ADMIN — OXYCODONE 5 MG PO TAB [10814]: 10 mg | ORAL | @ 12:00:00 | Stop: 2022-01-05 | NDC 00406055223

## 2022-01-05 MED ADMIN — ACETAMINOPHEN 500 MG PO TAB [102]: 1000 mg | ORAL | @ 10:00:00 | Stop: 2022-01-05 | NDC 00904673061

## 2022-01-05 MED ADMIN — TRAMADOL 50 MG PO TAB [14632]: 50 mg | ORAL | @ 04:00:00 | NDC 00904717961

## 2022-01-05 MED ADMIN — PANTOPRAZOLE 40 MG PO TBEC [80436]: 40 mg | ORAL | @ 15:00:00 | Stop: 2022-01-05 | NDC 00904647461

## 2022-01-05 MED ADMIN — CELECOXIB 200 MG PO CAP [76958]: 200 mg | ORAL | @ 15:00:00 | Stop: 2022-01-05 | NDC 00904650361

## 2022-01-05 MED FILL — ONDANSETRON HCL 4 MG PO TAB: 4 mg | ORAL | 10 days supply | Qty: 30 | Fill #1 | Status: CP

## 2022-01-05 MED FILL — TRAMADOL 50 MG PO TAB: 50 mg | ORAL | 15 days supply | Qty: 60 | Fill #1 | Status: CP

## 2022-01-05 MED FILL — OXYCODONE 5 MG PO TAB: 5 mg | ORAL | 4 days supply | Qty: 60 | Fill #1 | Status: CP

## 2022-01-05 MED FILL — CELECOXIB 200 MG PO CAP: 200 mg | ORAL | 14 days supply | Qty: 28 | Fill #1 | Status: CP

## 2022-01-05 MED FILL — ASPIRIN 81 MG PO TBEC: 81 mg | ORAL | 35 days supply | Qty: 70 | Fill #1 | Status: CP

## 2022-01-06 ENCOUNTER — Encounter: Admit: 2022-01-06 | Discharge: 2022-01-06 | Payer: BC Managed Care – HMO

## 2022-01-06 DIAGNOSIS — M549 Dorsalgia, unspecified: Secondary | ICD-10-CM

## 2022-01-06 DIAGNOSIS — M2550 Pain in unspecified joint: Secondary | ICD-10-CM

## 2022-01-06 DIAGNOSIS — M5136 Other intervertebral disc degeneration, lumbar region: Secondary | ICD-10-CM

## 2022-01-06 DIAGNOSIS — F909 Attention-deficit hyperactivity disorder, unspecified type: Secondary | ICD-10-CM

## 2022-01-06 DIAGNOSIS — K219 Gastro-esophageal reflux disease without esophagitis: Secondary | ICD-10-CM

## 2022-01-07 ENCOUNTER — Encounter: Admit: 2022-01-07 | Discharge: 2022-01-07 | Payer: BC Managed Care – HMO

## 2022-01-07 DIAGNOSIS — Z96641 Presence of right artificial hip joint: Secondary | ICD-10-CM

## 2022-01-19 ENCOUNTER — Encounter: Admit: 2022-01-19 | Discharge: 2022-01-19 | Payer: BC Managed Care – HMO

## 2022-01-19 ENCOUNTER — Ambulatory Visit: Admit: 2022-01-19 | Discharge: 2022-01-20 | Payer: BC Managed Care – HMO

## 2022-01-19 DIAGNOSIS — F909 Attention-deficit hyperactivity disorder, unspecified type: Secondary | ICD-10-CM

## 2022-01-19 DIAGNOSIS — M549 Dorsalgia, unspecified: Secondary | ICD-10-CM

## 2022-01-19 DIAGNOSIS — K219 Gastro-esophageal reflux disease without esophagitis: Secondary | ICD-10-CM

## 2022-01-19 DIAGNOSIS — M2550 Pain in unspecified joint: Secondary | ICD-10-CM

## 2022-01-19 DIAGNOSIS — Z96641 Presence of right artificial hip joint: Secondary | ICD-10-CM

## 2022-01-19 DIAGNOSIS — M5136 Other intervertebral disc degeneration, lumbar region: Secondary | ICD-10-CM

## 2022-01-19 NOTE — Progress Notes
Orthopaedic Surgery History and Physical - Marquis Lunch, MD    Referring Provider: Dannielle Burn    Date of Visit: 01/19/2022    CHIEF COMPLAINT: Two-week follow-up anterior right total hip arthroplasty     DOS: 01/04/22    HISTORY OF PRESENT ILLNESS: Dorothy Mueller is a 61 y.o. female who presents for approximate 2 week follow-up after undergoing anterior right total hip arthroplasty.  Reports that the hip is doing well overall.  No fevers chills or night sweats.  No incisional concerns.  No shortness of breath or chest pain.  She is taking Tramadol for pain and aspirin for DVT prophylaxis. Ambulating with a cane.     PHYSICAL EXAM:  Vitals:    01/19/22 0829   BP: 126/62   Pulse: 80       General: Patient is alert and awake.  No acute distress.    MSK Focused Operative/Affected Lower Extremity:  -Incision: Well-appearing incision with no erythema, induration, or drainage.  No fluctuance.  -Hip: Non-tender, non-painful passive ROM  -Calf soft and non-tender  -Neurovascular: Feet are warm and well perfused.  5/5 TA, GSC, EHL, and FHL. No clonus.     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    PLAN:   Follow-up in 4 weeks for 6 week visit with an AP x-ray of the pelvis  Continue mechanical and chemical DVT prophylaxis for 5 weeks postoperatively from an orthopedic standpoint  No hip precautions  Activity as tolerated  Weaned from assistive devices as tolerated  Continue with physical therapy    ________________________________________________        Zara Chess APRN FNP-C  Orthopedic Surgery - Hip and Knee Total Joint Arthroplasty      Marquis Lunch MD - Orthopedic Surgeon  - Supervising Physician  [facility] - Phone [phone] - Fax [phone]   [address], [address] - [place], [place] [address]    [place] Palmer, RN, BSN - Clinical Nurse Coordinator    Clinic phone line: [phone] - this phone is not physically able to be answered every day, but voicemail is checked regularly and responded to as quickly as possible.  Preferred contact for non-urgent clinical questions: MyChart  For follow up appointments, please call [phone].

## 2022-01-25 ENCOUNTER — Encounter: Admit: 2022-01-25 | Discharge: 2022-01-25 | Payer: BC Managed Care – HMO

## 2022-01-25 MED ORDER — AMOXICILLIN 500 MG PO TAB
ORAL_TABLET | ORAL | 3 refills | 7.00000 days | Status: AC
Start: 2022-01-25 — End: ?

## 2022-01-27 ENCOUNTER — Encounter: Admit: 2022-01-27 | Discharge: 2022-01-27 | Payer: BC Managed Care – HMO

## 2022-01-27 MED ORDER — TRAMADOL 50 MG PO TAB
50 mg | ORAL_TABLET | ORAL | 0 refills | Status: AC | PRN
Start: 2022-01-27 — End: ?

## 2022-02-14 ENCOUNTER — Encounter: Admit: 2022-02-14 | Discharge: 2022-02-14 | Payer: BC Managed Care – HMO

## 2022-02-14 DIAGNOSIS — Z96641 Presence of right artificial hip joint: Secondary | ICD-10-CM

## 2022-02-17 ENCOUNTER — Encounter: Admit: 2022-02-17 | Discharge: 2022-02-17 | Payer: BC Managed Care – HMO

## 2022-02-18 ENCOUNTER — Encounter: Admit: 2022-02-18 | Discharge: 2022-02-18 | Payer: BC Managed Care – HMO

## 2022-02-18 ENCOUNTER — Ambulatory Visit: Admit: 2022-02-18 | Discharge: 2022-02-18 | Payer: BC Managed Care – HMO

## 2022-02-18 DIAGNOSIS — K219 Gastro-esophageal reflux disease without esophagitis: Secondary | ICD-10-CM

## 2022-02-18 DIAGNOSIS — M5136 Other intervertebral disc degeneration, lumbar region: Secondary | ICD-10-CM

## 2022-02-18 DIAGNOSIS — M549 Dorsalgia, unspecified: Secondary | ICD-10-CM

## 2022-02-18 DIAGNOSIS — M2550 Pain in unspecified joint: Secondary | ICD-10-CM

## 2022-02-18 DIAGNOSIS — M25551 Pain in right hip: Secondary | ICD-10-CM

## 2022-02-18 DIAGNOSIS — F909 Attention-deficit hyperactivity disorder, unspecified type: Secondary | ICD-10-CM

## 2022-03-28 ENCOUNTER — Encounter: Admit: 2022-03-28 | Discharge: 2022-03-28 | Payer: BC Managed Care – HMO

## 2022-03-28 DIAGNOSIS — Z96641 Presence of right artificial hip joint: Secondary | ICD-10-CM

## 2022-03-29 NOTE — Progress Notes
Orthopaedic Surgery History and Physical - Marquis Lunch, MD    Referring Provider: No ref. provider found    Date of Visit: 04/01/2022       CHIEF COMPLAINT: Three month follow-up anterior right total hip arthroplasty    DOS: 01/04/22    HISTORY OF PRESENT ILLNESS: Dorothy Mueller is a 62 y.o. female who presents for approximate three month follow-up after undergoing anterior right total hip arthroplasty.  Reports that the hip is doing well overall.  No fevers chills or night sweats.  No incisional concerns.  No shortness of breath or chest pain.  She has been discharged from PT and has no complaints.         PHYSICAL EXAM:  There were no vitals filed for this visit.    General: Patient is alert and awake.  No acute distress.    MSK Focused Operative/Affected Lower Extremity:  -Incision: Well-appearing incision with no erythema, induration, or drainage.  No fluctuance.  -Hip: Non-tender, non-painful passive ROM  -Calf soft and non-tender  -Neurovascular: Feet warm & well perfused.  5/5 TA, GSC, EHL, and FHL. No clonus. SILT L4-S1    IMAGING: Imaging reviewed by physician   XR pelvis and right hip from today reveal well appearing cementless right total hip arthroplasty with no untoward features such as loosening, subsidence, fracture, wear, or osteolysis.  No additional fracture, dislocation, or subluxation is noted.       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    PLAN:   Follow-up at one year postop mark  No hip precautions with anterior approach surgery  Weight bearing as tolerated  Reviewed antibiotic prophylaxis for dental work  ________________________________________________       ANTIBIOTIC PROPHYLAXIS AFTER TOTAL JOINT REPLACEMENT    For protection against the remote possibility of blood-borne bacteria, carried from the mouth during a dental procedure, creating an infection in a total joint replacement, we have made the following recommendations:     Following total joint replacement, all patients are advised to take an antibiotic regimen for the following dental procedures FOR LIFETIME THERAPY:   Prophylactic cleaning of teeth or implants   Intraligamentary local anesthetic injections   Periodontal procedures   Root canal procedures   Dental extractions   Dental implant procedures   Implantation of avulsed teeth   Initial placement of orthodontic bands    The recommended antibiotic regimen (if not allergic to penicillin) is Amoxicillin, Cephalexin (e.g. Keflex), or Cephradine two (2.0) grams orally one (1) hour prior to the dental procedure.   For patients with a penicillin allergy, the recommended antibiotic is Clindamycin (e.g. Cleocin) 600 mg orally one hour prior to the dental procedure.   Antibiotic prophylaxis is not warranted for dental procedures for patients with previously placed orthopedic pins, plates or screws.   The above recommendations are considered minimum guidelines.  Your doctor and/or dentist are ultimately responsible for making individual treatment recommendations to you based on their clinical judgement.              Parish Augustine APRN FNP-C  Orthopedic Surgery - Hip and Knee Total Joint Arthroplasty      Marquis Lunch MD - Orthopedic Surgeon  - Supervising Physician  [facility] - Phone [phone] - Fax [phone]   [address] - [address] [place], [place] [address]    [place] Palmer, RN, BSN - Clinical Nurse Coordinator    Clinic  phone line: [phone] - this phone is not physically able to be answered every day, but voicemail is checked regularly and responded to as quickly as possible.  Preferred contact for non-urgent clinical questions: MyChart  For follow up appointments, please call [phone].

## 2022-04-01 ENCOUNTER — Encounter: Admit: 2022-04-01 | Discharge: 2022-04-01 | Payer: BC Managed Care – HMO

## 2022-04-01 ENCOUNTER — Ambulatory Visit: Admit: 2022-04-01 | Discharge: 2022-04-01 | Payer: BC Managed Care – HMO

## 2022-04-01 DIAGNOSIS — Z96641 Presence of right artificial hip joint: Secondary | ICD-10-CM

## 2022-04-01 DIAGNOSIS — M5136 Other intervertebral disc degeneration, lumbar region: Secondary | ICD-10-CM

## 2022-04-01 DIAGNOSIS — F909 Attention-deficit hyperactivity disorder, unspecified type: Secondary | ICD-10-CM

## 2022-04-01 DIAGNOSIS — K219 Gastro-esophageal reflux disease without esophagitis: Secondary | ICD-10-CM

## 2022-04-01 DIAGNOSIS — M2550 Pain in unspecified joint: Secondary | ICD-10-CM

## 2022-04-01 DIAGNOSIS — M549 Dorsalgia, unspecified: Secondary | ICD-10-CM

## 2022-08-05 ENCOUNTER — Encounter: Admit: 2022-08-05 | Discharge: 2022-08-05 | Payer: BC Managed Care – HMO

## 2022-08-12 IMAGING — MG DGNSTC DIG BRST TOMOSYNTHESIS UNI OR BILAT
8 of 16 series · 8 of 40 positions shown · non-contrast
Comparison: 04/13/2020, 02/26/2019
Digital 3D CC, lateral and MLO views were obtained.

[id number]
*********THIS IS A DIAGNOSTIC EXAM TO BE READ BY THE DIAGNOSTIC DOCTOR**************
PROCEDURE: [id number]
REASON FOR EXAM: Diagnostic Mammogram probable lump right breast

[R CC (1 of 2)]
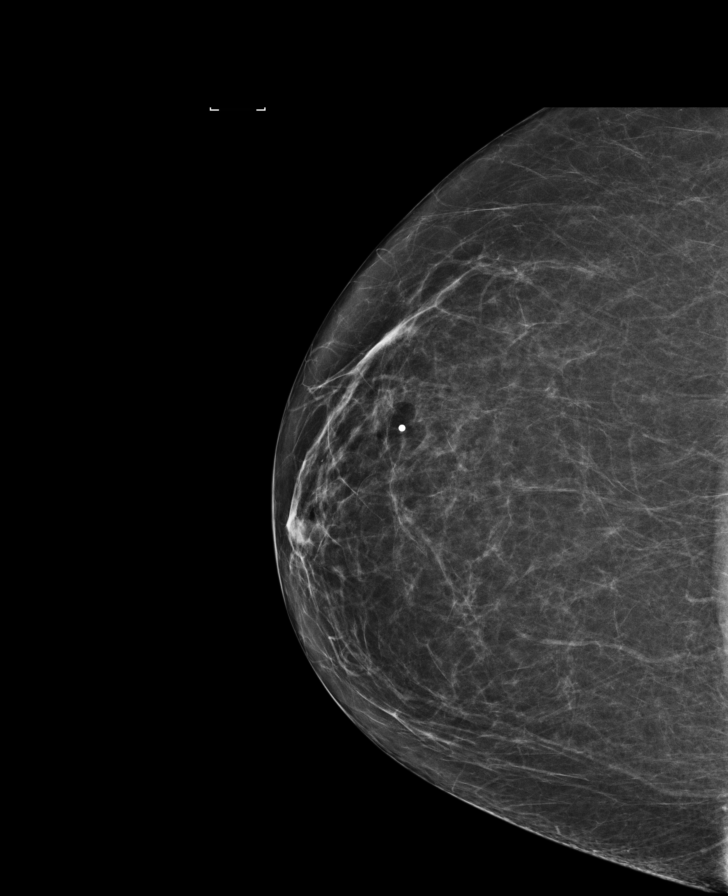

[R ML]
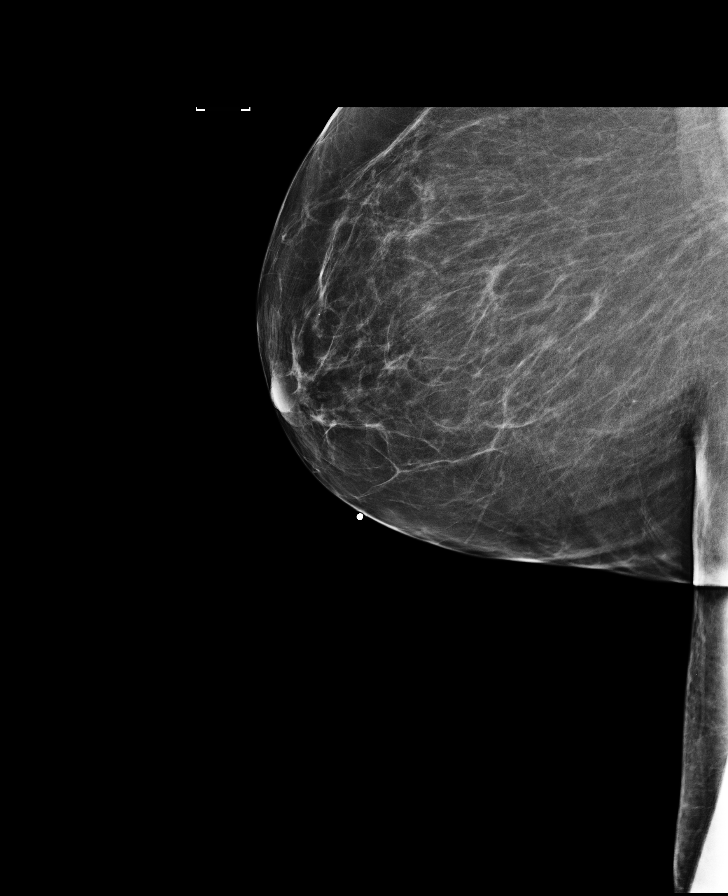

[R ML synth-2D]
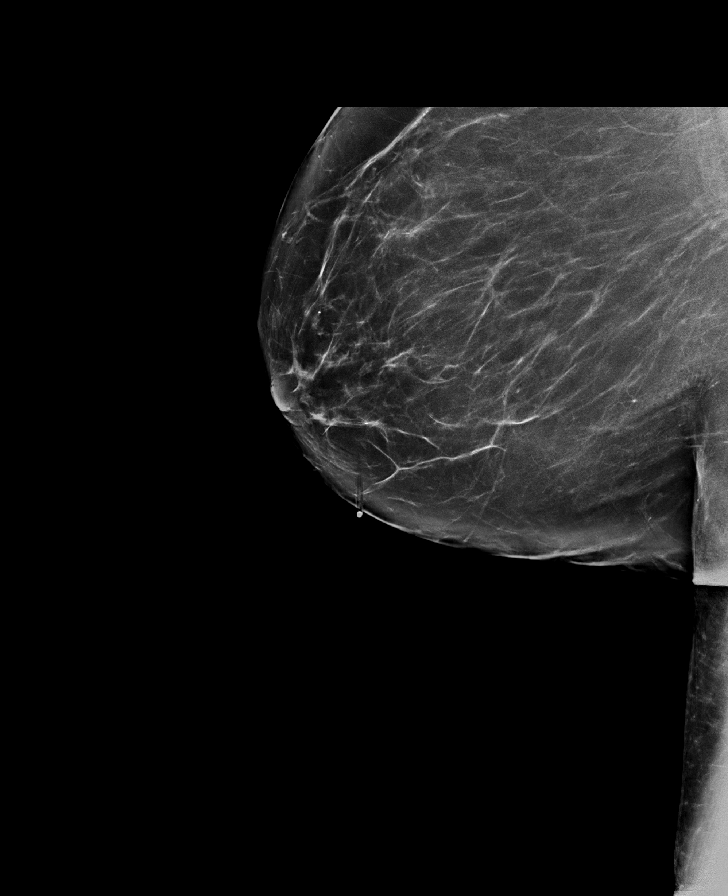

[R CC (2 of 2)]
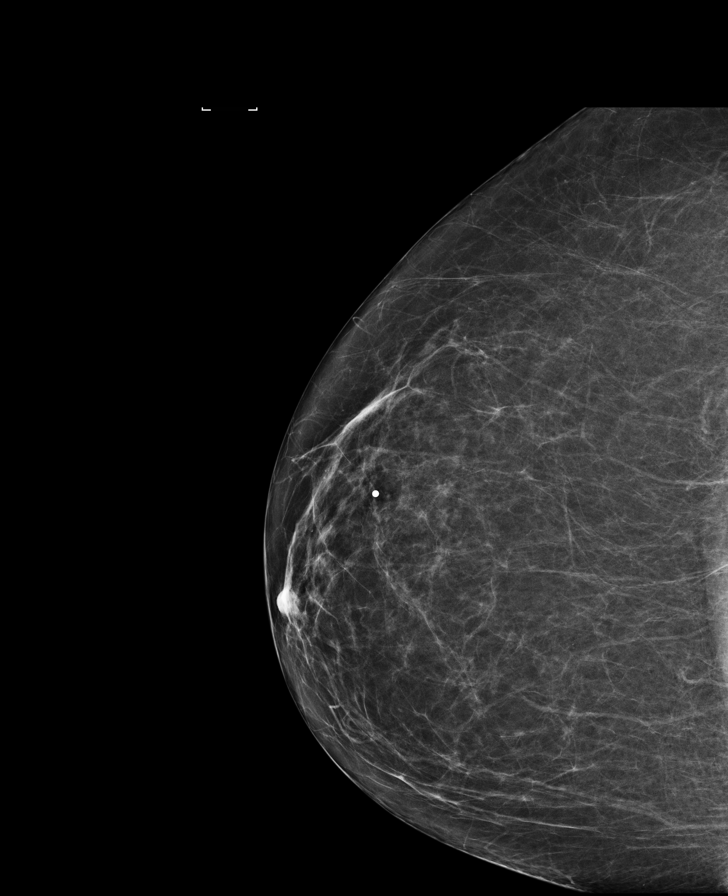

[L MLO synth-2D]
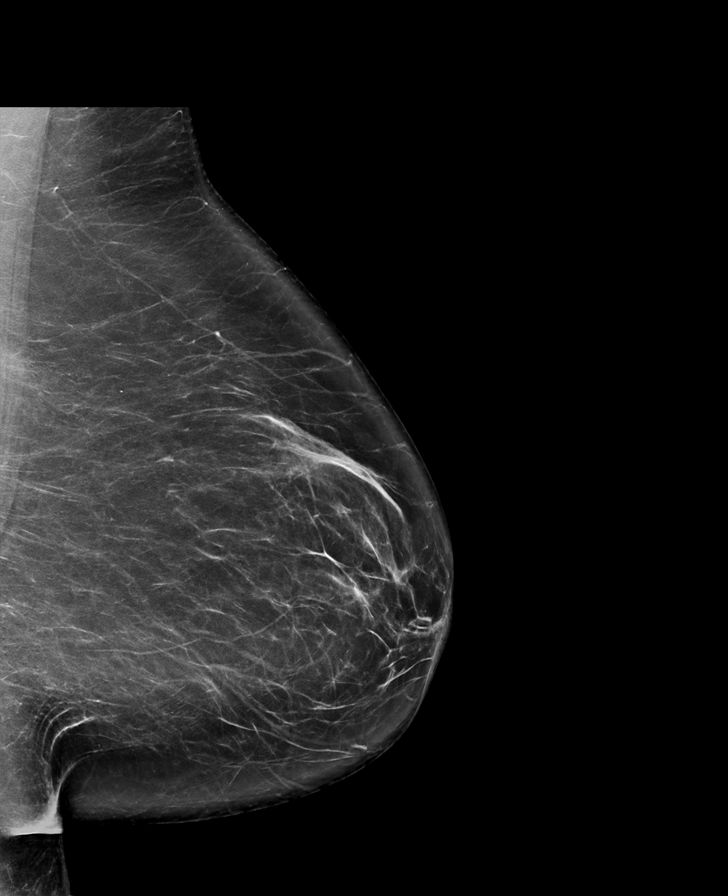

[R CC synth-2D]
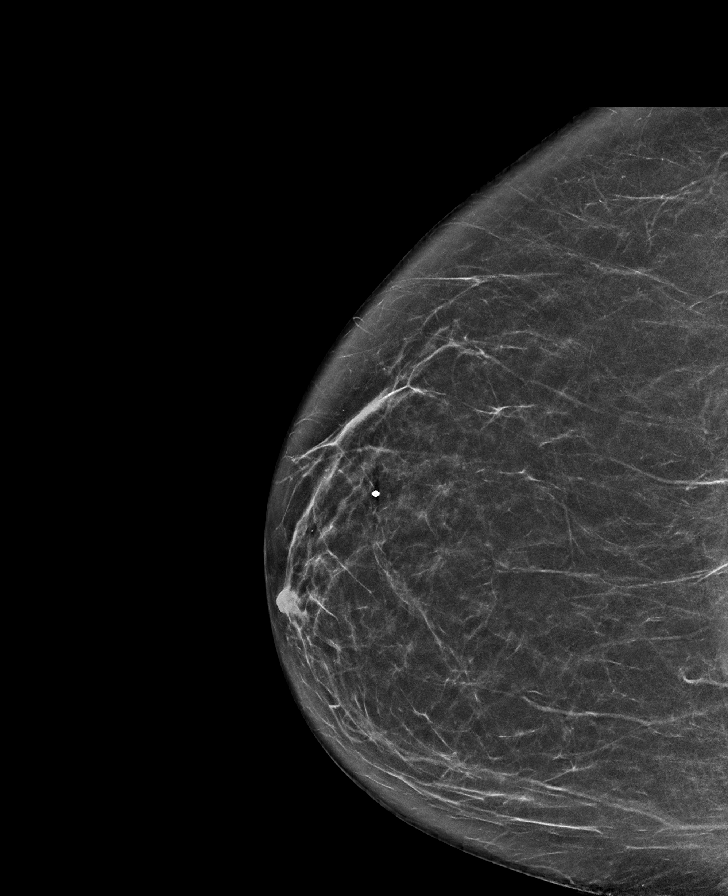

[L CC synth-2D]
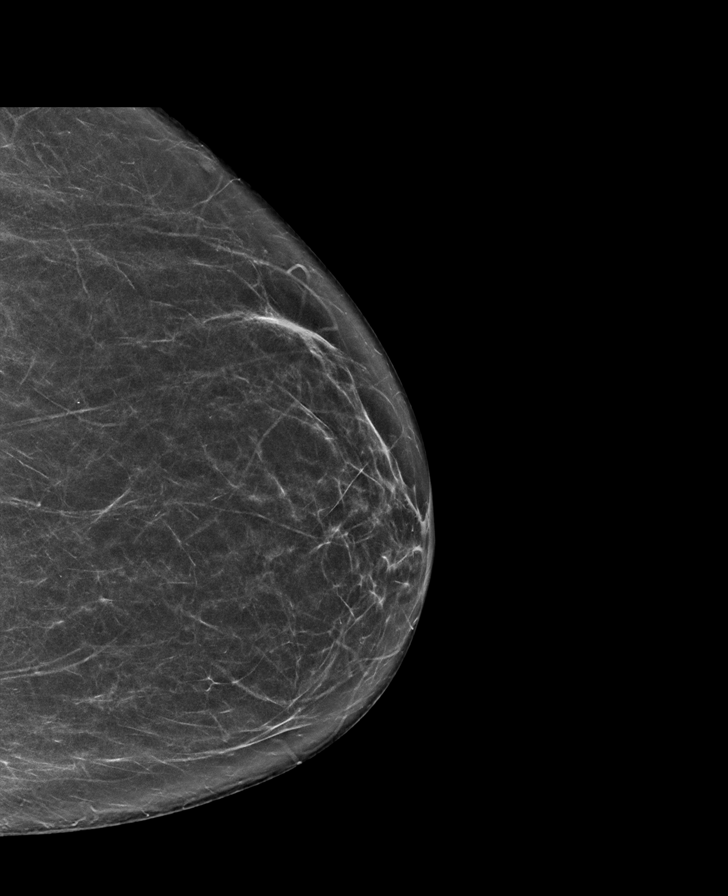

[L CC]
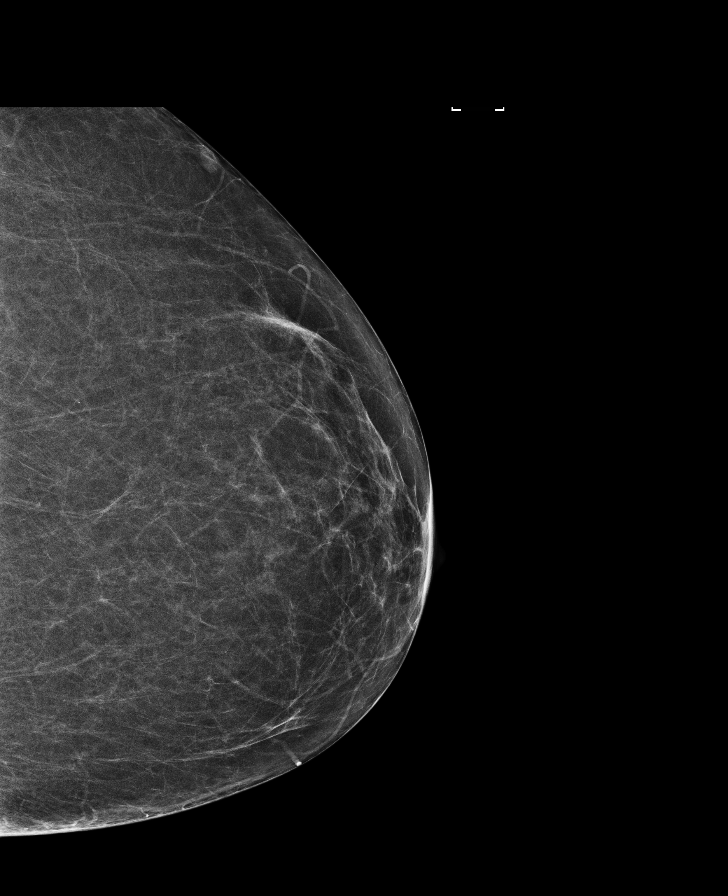

[8 of 40 positions shown; findings below may reference images not displayed]

FINDINGS: Density: There are scattered fibroglandular densities, which could obscure a lesion on mammography.
There is no dominant mass, suspicious cluster of microcalcifications, or other evidence to indicate
malignancy. Specifically, no abnormality seen in the area of the palpable finding on the right.
IMPRESSION: No mammographic abnormalities identified. Ultrasound is recommended for further
evaluation of the palpable finding in the right breast. Please see separate report.
Result Code: (IA) BI-RADS 0:  Incomplete Assessment
Follow up: (ULTRA) Ultrasound
NOTE:  Keep in mind that about 10% of breast cancers will not be identified on mammography.  Further
evaluation of a palpable mass not seen on mammography should be based on clinical grounds, including
biopsy if indicated.

## 2022-11-18 ENCOUNTER — Encounter: Admit: 2022-11-18 | Discharge: 2022-11-18 | Payer: BC Managed Care – HMO

## 2022-11-21 ENCOUNTER — Encounter: Admit: 2022-11-21 | Discharge: 2022-11-21 | Payer: BC Managed Care – HMO

## 2022-12-13 NOTE — Patient Instructions
It was nice to see you today.  Thank you for choosing to visit our clinic.  Your time is important, and if you had to wait today, we do apologize.  Our goal is to run exactly on time.  However, on occasion, we get behind in clinic due to unexpected patient issues.  Thank you for your patience.    General Instructions:  Scheduling:  Our scheduling phone number is [phone].  Appointment Reminders on your cell phone:  Communication preferences can be managed in MyChart to ensure you receive important appointment notifications  How to reach our office:  Please send a MyChart message to the [facility] (directed to Dr. Suan Halter) or leave a voicemail for the nurse, Elonda Husky, at [phone].  Support for many chronic illnesses is available through [employer] at [email or web address] or [phone].    For help with MyChart:  please call [phone].    For more information on spinal conditions:  please visit [email or web address]     Again, thank you for coming in today.

## 2022-12-20 ENCOUNTER — Encounter: Admit: 2022-12-20 | Discharge: 2022-12-20 | Payer: BC Managed Care – HMO

## 2022-12-21 ENCOUNTER — Encounter: Admit: 2022-12-21 | Discharge: 2022-12-21 | Payer: BC Managed Care – HMO

## 2022-12-21 ENCOUNTER — Ambulatory Visit: Admit: 2022-12-21 | Discharge: 2022-12-22 | Payer: BC Managed Care – HMO

## 2022-12-21 DIAGNOSIS — F909 Attention-deficit hyperactivity disorder, unspecified type: Secondary | ICD-10-CM

## 2022-12-21 DIAGNOSIS — M2550 Pain in unspecified joint: Secondary | ICD-10-CM

## 2022-12-21 DIAGNOSIS — M549 Dorsalgia, unspecified: Secondary | ICD-10-CM

## 2022-12-21 DIAGNOSIS — M48061 Spinal stenosis, lumbar region without neurogenic claudication: Secondary | ICD-10-CM

## 2022-12-21 DIAGNOSIS — K219 Gastro-esophageal reflux disease without esophagitis: Secondary | ICD-10-CM

## 2022-12-21 DIAGNOSIS — M5134 Other intervertebral disc degeneration, thoracic region: Secondary | ICD-10-CM

## 2022-12-21 DIAGNOSIS — M51369 Degenerative disc disease, lumbar: Secondary | ICD-10-CM

## 2022-12-21 DIAGNOSIS — D539 Nutritional anemia, unspecified: Secondary | ICD-10-CM

## 2022-12-21 NOTE — Progress Notes
[facility] CLINIC NOTE     Dictation on: 12/21/2022  3:44 PM by: Wyona Almas [LCORDELL]              Vitals:    12/21/22 1349   BP: (!) 171/73   BP Source: Arm, Right Upper   Pulse: 82   Temp: 36.9 ?C (98.5 ?F)   SpO2: 96%   PainSc: Five   Weight: 109.3 kg (241 lb)   Height: 171.5 cm (5' 7.5)       Review of Systems   Constitutional: Negative.    HENT: Negative.     Eyes: Negative.    Respiratory: Negative.     Cardiovascular: Negative.    Gastrointestinal: Negative.    Endocrine: Negative.    Genitourinary: Negative.    Musculoskeletal:  Positive for back pain.   Skin: Negative.    Allergic/Immunologic: Negative.    Neurological: Negative.    Hematological: Negative.    Psychiatric/Behavioral: Negative.         Total time 60 minutes.

## 2022-12-22 ENCOUNTER — Encounter: Admit: 2022-12-22 | Discharge: 2022-12-22 | Payer: BC Managed Care – HMO

## 2022-12-22 NOTE — Telephone Encounter
Dr. Suan Halter have ask for additional medical information. Gave patient best fax number as well as a direct phone number. Patient will fax over injection result from the last 2 years.

## 2022-12-28 ENCOUNTER — Encounter: Admit: 2022-12-28 | Discharge: 2022-12-28 | Payer: BC Managed Care – HMO

## 2022-12-29 ENCOUNTER — Encounter: Admit: 2022-12-29 | Discharge: 2022-12-29 | Payer: BC Managed Care – HMO

## 2022-12-29 DIAGNOSIS — M48061 Spinal stenosis, lumbar region without neurogenic claudication: Secondary | ICD-10-CM

## 2023-01-03 ENCOUNTER — Encounter: Admit: 2023-01-03 | Discharge: 2023-01-03 | Payer: BC Managed Care – HMO

## 2023-01-09 ENCOUNTER — Encounter: Admit: 2023-01-09 | Discharge: 2023-01-09 | Payer: BC Managed Care – HMO

## 2023-01-23 ENCOUNTER — Encounter: Admit: 2023-01-23 | Discharge: 2023-01-23 | Payer: BC Managed Care – HMO

## 2023-01-30 ENCOUNTER — Ambulatory Visit: Admit: 2023-01-30 | Discharge: 2023-01-31 | Payer: BC Managed Care – HMO

## 2023-01-30 ENCOUNTER — Encounter: Admit: 2023-01-30 | Discharge: 2023-01-30 | Payer: BC Managed Care – HMO

## 2023-01-30 DIAGNOSIS — M47817 Spondylosis without myelopathy or radiculopathy, lumbosacral region: Secondary | ICD-10-CM

## 2023-01-30 DIAGNOSIS — M48061 Spinal stenosis, lumbar region without neurogenic claudication: Secondary | ICD-10-CM

## 2023-01-30 NOTE — Procedures
Attending Surgeon: Lydia Guiles, MD    Anesthesia: Local    Pre-Procedure Diagnosis: No diagnosis found.    Post-Procedure Diagnosis: No diagnosis found.    Procedures  Estimated blood loss: none or minimal  Specimens: none  Patient tolerated the procedure well with no immediate complications. Pressure was applied, and hemostasis was accomplished.

## 2023-01-30 NOTE — Procedures
Electrodiagnostic Laboratory Report    Impression: Normal Study, BLE    1.There is no electrodiagnostic evidence of a right or left tibial mononeuropathy, peroneal mononeuropathy, left lumbosacral plexopathy, left lumbar radiculopathy, or peripheral polyneuropathy.   2. The right leg sural and superficial peroneal sensory studies were unobtainable, however this is likely due to body habitus and technical error as the studies were obtainable in the left leg and there is no clinical correlation. A left ulnar sensory study was performed to ensure no electrodiagnostic evidence of a peripheral sensory polyneuropathy.    Clinical Correlation:    1. The findings were explained to the patient.  2. Follow up with referring provider.  3. Can consider medial branch blocks for diagnostic evaluation of axial low back pain.    Thank you for allowing me to perform electrodiagnostic testing on your patients.  A full EMG/NCS report will be scanned into O2/EPIC, including waveforms.  If you have any further questions or comments, please do not hesitate to call.    Lydia Guiles, MD  Interventional Spine and Musculoskeletal Physiatrist  Assistant Professor, Department of Physical Medicine & Rehabilitation  Diplomate, American Board of Physical Medicine and Rehabilitation

## 2023-01-31 ENCOUNTER — Encounter: Admit: 2023-01-31 | Discharge: 2023-01-31 | Payer: BC Managed Care – HMO

## 2023-02-01 ENCOUNTER — Encounter: Admit: 2023-02-01 | Discharge: 2023-02-01 | Payer: BC Managed Care – HMO

## 2023-03-10 ENCOUNTER — Encounter: Admit: 2023-03-10 | Discharge: 2023-03-10 | Payer: BC Managed Care – HMO

## 2023-04-07 ENCOUNTER — Encounter: Admit: 2023-04-07 | Discharge: 2023-04-07 | Payer: BC Managed Care – HMO

## 2023-04-20 ENCOUNTER — Encounter: Admit: 2023-04-20 | Discharge: 2023-04-20 | Payer: BC Managed Care – HMO

## 2023-04-20 NOTE — Patient Instructions
 It was nice to see you today.  Thank you for choosing to visit our clinic.  Your time is important, and if you had to wait today, we do apologize.  Our goal is to run exactly on time.  However, on occasion, we get behind in clinic due to unexpected patient issues.  Thank you for your patience.    General Instructions:  Scheduling:  Our scheduling phone number is [phone].  Appointment Reminders on your cell phone:  Communication preferences can be managed in MyChart to ensure you receive important appointment notifications  How to reach our office:  Please send a MyChart message to the [facility] (directed to Dr. Suan Halter) or leave a voicemail for his MA and Nurse, Rhoderick Moody and Brock Hall,  at [phone].  Support for many chronic illnesses is available through [employer] at [email or web address] or [phone].    For help with MyChart:  please call [phone].    For more information on spinal conditions:  please visit [email or web address]     Again, thank you for coming in today.

## 2023-04-25 ENCOUNTER — Encounter: Admit: 2023-04-25 | Discharge: 2023-04-25 | Payer: BC Managed Care – HMO

## 2023-04-26 ENCOUNTER — Ambulatory Visit: Admit: 2023-04-26 | Discharge: 2023-04-27 | Payer: BC Managed Care – HMO

## 2023-04-26 ENCOUNTER — Encounter: Admit: 2023-04-26 | Discharge: 2023-04-26 | Payer: BC Managed Care – HMO

## 2023-04-26 DIAGNOSIS — M4807 Spinal stenosis, lumbosacral region: Secondary | ICD-10-CM

## 2023-04-26 NOTE — Progress Notes
 [facility] CLINIC NOTE     Dictation on: 04/26/2023 12:48 PM by: Wyona Almas [LCORDELL]              Vitals:    04/26/23 1158   BP: (!) 148/80   Pulse: 94   Temp: 36.2 ?C (97.2 ?F)   SpO2: 99%   PainSc: Two   Weight: 109.8 kg (242 lb)   Height: 171.5 cm (5' 7.5)       Review of Systems   Constitutional: Negative.    HENT: Negative.     Eyes: Negative.    Respiratory: Negative.     Cardiovascular: Negative.    Gastrointestinal: Negative.    Endocrine: Negative.    Genitourinary: Negative.    Musculoskeletal:  Positive for back pain.   Skin: Negative.    Allergic/Immunologic: Negative.    Neurological: Negative.    Hematological: Negative.    Psychiatric/Behavioral: Negative.       Total time 30 minutes.

## 2023-05-25 ENCOUNTER — Encounter: Admit: 2023-05-25 | Discharge: 2023-05-25 | Payer: BC Managed Care – HMO

## 2023-08-28 ENCOUNTER — Encounter: Admit: 2023-08-28 | Discharge: 2023-08-28 | Payer: BLUE CROSS/BLUE SHIELD

## 2023-08-28 DIAGNOSIS — M5136 Degeneration of intervertebral disc of lumbar region with discogenic back pain: Principal | ICD-10-CM

## 2023-08-28 DIAGNOSIS — M4807 Spinal stenosis, lumbosacral region: Secondary | ICD-10-CM

## 2023-09-18 ENCOUNTER — Encounter: Admit: 2023-09-18 | Discharge: 2023-09-18 | Payer: BLUE CROSS/BLUE SHIELD

## 2023-09-20 ENCOUNTER — Encounter: Admit: 2023-09-20 | Discharge: 2023-09-20 | Payer: BLUE CROSS/BLUE SHIELD

## 2023-09-20 NOTE — Telephone Encounter
 This RN contacted the patient for pre-visit questions regarding /their procedure with Dr. Abram next Wednesday, 7/30. The following information was reviewed:    Your procedure is scheduled for 0715 with a check in time of 0700. You do not need to fast for this procedure. A driver is recommended if this is your first injection, but not required.  The patient verbalized understanding.

## 2023-09-27 ENCOUNTER — Ambulatory Visit: Admit: 2023-09-27 | Discharge: 2023-09-27 | Payer: BLUE CROSS/BLUE SHIELD

## 2023-09-27 ENCOUNTER — Ambulatory Visit: Admit: 2023-09-27 | Discharge: 2023-09-28 | Payer: BLUE CROSS/BLUE SHIELD

## 2023-09-27 ENCOUNTER — Encounter: Admit: 2023-09-27 | Discharge: 2023-09-27 | Payer: BLUE CROSS/BLUE SHIELD

## 2023-09-28 ENCOUNTER — Encounter: Admit: 2023-09-28 | Discharge: 2023-09-28 | Payer: BLUE CROSS/BLUE SHIELD

## 2023-10-09 ENCOUNTER — Encounter: Admit: 2023-10-09 | Discharge: 2023-10-09 | Payer: BLUE CROSS/BLUE SHIELD

## 2023-10-11 ENCOUNTER — Encounter: Admit: 2023-10-11 | Discharge: 2023-10-11 | Payer: BLUE CROSS/BLUE SHIELD

## 2023-10-16 ENCOUNTER — Ambulatory Visit: Admit: 2023-10-16 | Discharge: 2023-10-17 | Payer: BLUE CROSS/BLUE SHIELD

## 2023-10-16 ENCOUNTER — Encounter: Admit: 2023-10-16 | Discharge: 2023-10-16 | Payer: BLUE CROSS/BLUE SHIELD

## 2023-10-16 DIAGNOSIS — M47816 Spondylosis without myelopathy or radiculopathy, lumbar region: Principal | ICD-10-CM

## 2023-10-16 MED ORDER — GABAPENTIN 600 MG PO TAB
600 mg | ORAL_TABLET | Freq: Three times a day (TID) | ORAL | 3 refills | 30.00000 days | Status: AC
Start: 2023-10-16 — End: ?

## 2023-10-16 NOTE — Progress Notes
 [facility] Interventional [facility]  PATIENT HISTORY AND PHYSICAL  Subjective     Chief Complaint: Pain  Chief Complaint   Patient presents with    Lower Back - Pain    Middle Back - Pain    Follow Up    Buttocks pain       HPI: Dorothy Mueller is a 63 y.o. female who  has a past medical history of ADHD (attention deficit hyperactivity disorder) (2015), Back pain, Degenerative disc disease, lumbar, Degenerative disc disease, thoracic, GERD (gastroesophageal reflux disease), Joint pain (2001), and Unspecified deficiency anemia (August 2024). who presents for evaluation.    Dorothy Mueller presents with lumbar spine pain despite recent L3-4 LESI. She experiences pain in her lower back, described as 'lightning bolts' in nature. The pain is localized to the lower back and does not currently radiate down her legs. The pain remains in the low back and is predominately axial. Despite having an epidural less than a month ago, which previously provided longer relief, the pain has returned.    She has a history of receiving multiple epidural injections at a different facility in [place]. Despite these treatments, the relief has been only temporary, and the pain has returned. She recalls a period in February where she experienced no pain for about two weeks without any apparent reason, allowing her to be more active and productive at home.    Her current medication regimen includes gabapentin , which she takes at a dose of 600 mg three times a day. She initially started on a lower dose of 100 mg twice a day, which was ineffective. She also uses cyclobenzaprine daily, which she finds helpful, and occasionally takes Tylenol  and ibuprofen for additional pain management. She previously used morphine 5 mg for relaxation from her PCP but no longer feels the need for it.    She has previously been evaluated by Dr. Roe with orthopedic surgery. He recommended conservative measures to manage her pain such as participating in physical therapy exercises and considering medial branch blocks with subsequent RFA.        PRIOR MEDICATIONS:   Effective  Gabapentin   Cyclobenzaprine  NSAID  Acetaminophen     Ineffective      Unable to tolerate      Never  Lyrica  Ami/Nortriptyline  Cymbalta  Tizanidine      PRIOR INTERVENTIONS:   Effective  PT    Ineffective  LESIs - OSH        Brendaly Townsel denies any recent fevers, chills, infection, antibiotics, bowel or bladder incontinence, saddle anesthesia, bleeding issues, or recent anticoagulant.     ROS: All 14 systems reviewed and found to be negative except as above and as follows.    Past Medical History:  Past Medical History:    ADHD (attention deficit hyperactivity disorder)    Back pain    Degenerative disc disease, lumbar    Degenerative disc disease, thoracic    GERD (gastroesophageal reflux disease)    Joint pain    Unspecified deficiency anemia       Family History:  Family History   Problem Relation Name Age of Onset    Arthritis Mother Heron         75 y/o    Cancer Mother Heron         Breast, in-situ    Stroke Maternal Grandmother Albina     Alcohol liver disease Paternal Grandmother Vila     Arthritis Sister Janee  8 years younger than me    Heart Attack Father Charlie Hutchinson Zhorne         Massive MI age 39    Alcohol liver disease Grandparent Vila Zhorne     Alcohol liver disease Grandparent Eleanor Zhorne        Social History:  Lives in [place] [address]    Social History     Socioeconomic History    Marital status: Married   Tobacco Use    Smoking status: Former     Current packs/day: 0.00     Average packs/day: 1 pack/day for 25.0 years (25.0 ttl pk-yrs)     Types: Cigarettes     Start date: 04/29/1978     Quit date: 04/29/2003     Years since quitting: 20.4    Smokeless tobacco: Never    Tobacco comments:     Quit cold [place] with bronchitis   Vaping Use    Vaping status: Never Used   Substance and Sexual Activity    Alcohol use: Yes     Alcohol/week: 2.0 standard drinks of alcohol     Types: 2 Glasses of wine per week     Comment: Social drinker, more when camping :)    Drug use: Never    Sexual activity: Not Currently     Partners: Male     Birth control/protection: None       Allergies:  Allergies[1]    Medications:  Current Medications[2]    Physical examination:   BP 139/78  - Pulse 92  - Ht 172.7 cm (5' 8)  - Wt 108.9 kg (240 lb)  - SpO2 96%  - BMI 36.49 kg/m?   Pain Score: Eight  Oswestry Total Score:: (Patient-Rptd) 34    General: The patient is a well-developed, well nourished 63 y.o. female in no acute distress.   HEENT: Head is normocephalic and atraumatic. EOMI bilaterally.   Cardiac: Based on palpation, pulse appears to be regular rate and rhythm.   Pulmonary: The patient has unlabored respirations and bilateral symmetric chest excursion.   Abdomen: Soft, nontender, and nondistended. There is no rebound or guarding.   Extremities: No clubbing, cyanosis, or edema.     Musculoskeletal:   Gait is normal.    L-Spine   There is mild paraspinal tenderness. Paraspinal muscle tone is slightly increased.  Facet loading is positive.  There is no tenderness or radiating pain with palpation over the SI joints, piriformis, or greater trochanteric bursae bilaterally.  ROM with flexion, extension, rotation, and lateral bending is intact.  Strength is equal and adequate bilaterally in the flexors and extensors of the bilateral lower extremities.   SLR is negative bilaterally.          Last Cr and LFT's:  Creatinine   Date Value Ref Range Status   01/05/2022 0.68 0.4 - 1.00 MG/DL Final     AST (SGOT)   Date Value Ref Range Status   12/30/2021 13 7 - 40 U/L Final     ALT (SGPT)   Date Value Ref Range Status   12/30/2021 8 7 - 56 U/L Final     Alk Phosphatase   Date Value Ref Range Status   12/30/2021 80 25 - 110 U/L Final     Total Bilirubin   Date Value Ref Range Status   12/30/2021 0.4 0.3 - 1.2 MG/DL Final          Assessment:    Dorothy Mueller is a 63  y.o. female who  has a past medical history of ADHD (attention deficit hyperactivity disorder) (2015), Back pain, Degenerative disc disease, lumbar, Degenerative disc disease, thoracic, GERD (gastroesophageal reflux disease), Joint pain (2001), and Unspecified deficiency anemia (August 2024). who presents for evaluation of pain.    The pain complaints are most likely due to:    1. Spondylosis without myelopathy or radiculopathy, lumbar region  Leonard AMB SPINE INJECT MBB LUMB/SACRAL          Patient has had an adequate trial of > 3 month of rest, exercise, multimodal treatment, and the passage of time without improvement of symptoms. The pain has significant impact on the daily quality of life.     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 Gabapentin  tolerated but not highly effective at lower dose. Cyclobenzaprine effective with daily use. Has never tried ablation.  - Continue gabapentin  600 mg TID.  - Schedule medial branch block at L4-S1.  - Proceed with radiofrequency ablation if test blocks successful.  - Consider pregabalin (Lyrica) in the future if gabapentin  becomes ineffective.  - Encourage participation in home physical therapy exercises to aid in core strengthening.    Risks/benefits of all pharmacologic and interventional treatments discussed and questions answered.                 [1]   Allergies  Allergen Reactions    Chlorhexidine  RASH   [2]   Current Outpatient Medications:     amoxicillin  (AMOXIL ) 500 mg tablet, 2g 1hr prior to procedure, Disp: 4 tablet, Rfl: 3    chlorhexidine  gluconate (BETASEPT  SURGICAL SCRUB) 4 % topical liquid, Apply  topically to affected area every 24 hours as needed., Disp: 236 mL, Rfl: 0    cyclobenzaprine (FLEXERIL) 5 mg tablet, Take one tablet by mouth daily as needed., Disp: , Rfl:     docusate (COLACE) 100 mg capsule, Take one capsule by mouth twice daily. (Patient not taking: Reported on 01/19/2022), Disp: 180 capsule, Rfl: 0    gabapentin  (NEURONTIN ) 600 mg tablet, Take one tablet by mouth three times daily., Disp: 90 tablet, Rfl: 3    lidocaine  (LIDODERM ) 5 % topical patch, Apply one patch topically to affected area every 24 hours as needed., Disp: , Rfl:     methylphenidate HCL (METADATE ER) 20 mg ER tablet, Take one tablet by mouth daily before breakfast., Disp: , Rfl:     nystatin -triamcinolone  (MYCOLOG II) 100,000 unit/g / 0.1 % topical ointment, APPLY OINTMENT TOPICALLY TO THE AFFECTED AREA TWICE DAILY AS DIRECTED, Disp: 30 g, Rfl: 1    nystatin -triamcinolone  (MYCOLOG II) 100,000 unit/g / 0.1 % topical ointment, Apply  topically to affected area twice daily., Disp: 30 g, Rfl: 1    ondansetron  HCL (ZOFRAN ) 4 mg tablet, Take one tablet by mouth every 8 hours as needed for Nausea or Vomiting. (Patient not taking: Reported on 01/19/2022), Disp: 30 tablet, Rfl: 0    pantoprazole  DR (PROTONIX ) 40 mg tablet, Take one tablet by mouth daily., Disp: , Rfl:     PARoxetine  HCL (PAXIL ) 10 mg tablet, Take one tablet by mouth daily., Disp: , Rfl:     traMADoL  (ULTRAM ) 50 mg tablet, Take one tablet by mouth every 6 hours as needed for Pain. (Patient not taking: Reported on 04/01/2022), Disp: 60 tablet, Rfl: 0

## 2023-11-01 ENCOUNTER — Encounter: Admit: 2023-11-01 | Discharge: 2023-11-01 | Payer: BLUE CROSS/BLUE SHIELD

## 2023-11-08 ENCOUNTER — Encounter: Admit: 2023-11-08 | Discharge: 2023-11-08 | Payer: BLUE CROSS/BLUE SHIELD

## 2023-11-08 ENCOUNTER — Ambulatory Visit: Admit: 2023-11-08 | Discharge: 2023-11-09 | Payer: BLUE CROSS/BLUE SHIELD

## 2023-11-08 ENCOUNTER — Ambulatory Visit: Admit: 2023-11-08 | Discharge: 2023-11-08 | Payer: BLUE CROSS/BLUE SHIELD

## 2023-11-09 ENCOUNTER — Encounter: Admit: 2023-11-09 | Discharge: 2023-11-09 | Payer: BLUE CROSS/BLUE SHIELD

## 2023-11-09 DIAGNOSIS — M47816 Spondylosis without myelopathy or radiculopathy, lumbar region: Principal | ICD-10-CM

## 2023-11-09 NOTE — Telephone Encounter
 MBB LUMBAR  Procedure: medial branch block   Laterality: bilateral  Location: - L3, L4, L5     Patient reports she had 80-85% relief for 6 hours following injection. Will continue on with plan to see patient for MBB #2.

## 2023-11-20 ENCOUNTER — Encounter: Admit: 2023-11-20 | Discharge: 2023-11-20 | Payer: BLUE CROSS/BLUE SHIELD

## 2023-11-27 ENCOUNTER — Ambulatory Visit: Admit: 2023-11-27 | Discharge: 2023-11-28 | Payer: BLUE CROSS/BLUE SHIELD

## 2023-11-27 ENCOUNTER — Ambulatory Visit: Admit: 2023-11-27 | Discharge: 2023-11-27 | Payer: BLUE CROSS/BLUE SHIELD

## 2023-11-27 ENCOUNTER — Encounter: Admit: 2023-11-27 | Discharge: 2023-11-27 | Payer: BLUE CROSS/BLUE SHIELD

## 2023-11-28 ENCOUNTER — Encounter: Admit: 2023-11-28 | Discharge: 2023-11-28 | Payer: BLUE CROSS/BLUE SHIELD

## 2023-11-28 DIAGNOSIS — M47816 Spondylosis without myelopathy or radiculopathy, lumbar region: Principal | ICD-10-CM

## 2023-12-13 ENCOUNTER — Encounter: Admit: 2023-12-13 | Discharge: 2023-12-13 | Payer: BLUE CROSS/BLUE SHIELD

## 2023-12-13 NOTE — Telephone Encounter
 This RN contacted the patient for pre-visit questions regarding their procedure with Dr. Abram. The following information was reviewed via voicemail message:    Your procedure is scheduled for 10.22.25 with a check in time of 07:00. Please have nothing to eat 6 hours prior to the procedure. You may have clear liquids up to 2 hours prior to the procedure. You should take all your medications as prescribed with sips of water . You will need to bring a driver.      Please contact our procedure clinic at [phone] for pre-procedure questions and confirmation of instructions.My Chart message sent.

## 2023-12-20 ENCOUNTER — Ambulatory Visit: Admit: 2023-12-20 | Discharge: 2023-12-20 | Payer: BLUE CROSS/BLUE SHIELD

## 2023-12-20 ENCOUNTER — Encounter: Admit: 2023-12-20 | Discharge: 2023-12-20 | Payer: BLUE CROSS/BLUE SHIELD

## 2023-12-20 ENCOUNTER — Ambulatory Visit: Admit: 2023-12-20 | Discharge: 2023-12-21 | Payer: BLUE CROSS/BLUE SHIELD

## 2024-01-01 ENCOUNTER — Encounter: Admit: 2024-01-01 | Discharge: 2024-01-01 | Payer: BLUE CROSS/BLUE SHIELD

## 2024-01-01 NOTE — Telephone Encounter [36]
 Patient MyChart message that she wanted a call back.   Called patient at [phone]. No answer   Ask to call back.

## 2024-01-08 ENCOUNTER — Encounter: Admit: 2024-01-08 | Discharge: 2024-01-08 | Payer: BLUE CROSS/BLUE SHIELD

## 2024-01-10 ENCOUNTER — Encounter: Admit: 2024-01-10 | Discharge: 2024-01-10 | Payer: BLUE CROSS/BLUE SHIELD

## 2024-01-10 ENCOUNTER — Ambulatory Visit: Admit: 2024-01-10 | Discharge: 2024-01-11 | Payer: BLUE CROSS/BLUE SHIELD
